# Patient Record
Sex: Female | Born: 1980 | Race: White | Hispanic: No | Marital: Single | Smoking: Current every day smoker
Health system: Southern US, Community
[De-identification: ages and names within clinical notes are randomized; demographics above are authoritative.]

## PROBLEM LIST (undated history)

## (undated) DIAGNOSIS — F909 Attention-deficit hyperactivity disorder, unspecified type: Secondary | ICD-10-CM

## (undated) DIAGNOSIS — I10 Essential (primary) hypertension: Secondary | ICD-10-CM

## (undated) DIAGNOSIS — N200 Calculus of kidney: Secondary | ICD-10-CM

## (undated) HISTORY — DX: Attention-deficit hyperactivity disorder, unspecified type: F90.9

## (undated) HISTORY — DX: Calculus of kidney: N20.0

## (undated) HISTORY — DX: Essential (primary) hypertension: I10

---

## 2001-10-09 ENCOUNTER — Other Ambulatory Visit: Admission: RE | Admit: 2001-10-09 | Discharge: 2001-10-09 | Payer: Self-pay | Admitting: *Deleted

## 2002-02-25 ENCOUNTER — Emergency Department (HOSPITAL_COMMUNITY): Admission: EM | Admit: 2002-02-25 | Discharge: 2002-02-25 | Payer: Self-pay | Admitting: Emergency Medicine

## 2002-02-25 ENCOUNTER — Encounter: Payer: Self-pay | Admitting: Emergency Medicine

## 2006-07-02 ENCOUNTER — Emergency Department (HOSPITAL_COMMUNITY): Admission: EM | Admit: 2006-07-02 | Discharge: 2006-07-02 | Payer: Self-pay | Admitting: Emergency Medicine

## 2008-06-15 IMAGING — CR DG CHEST 2V
2 series · 2 of 2 positions shown · non-contrast
Comparison: none

CLINICAL DATA: Chest pain. 
 CHEST ? 2 VIEW ? 07/02/06:

[view not recorded (1 of 2)]
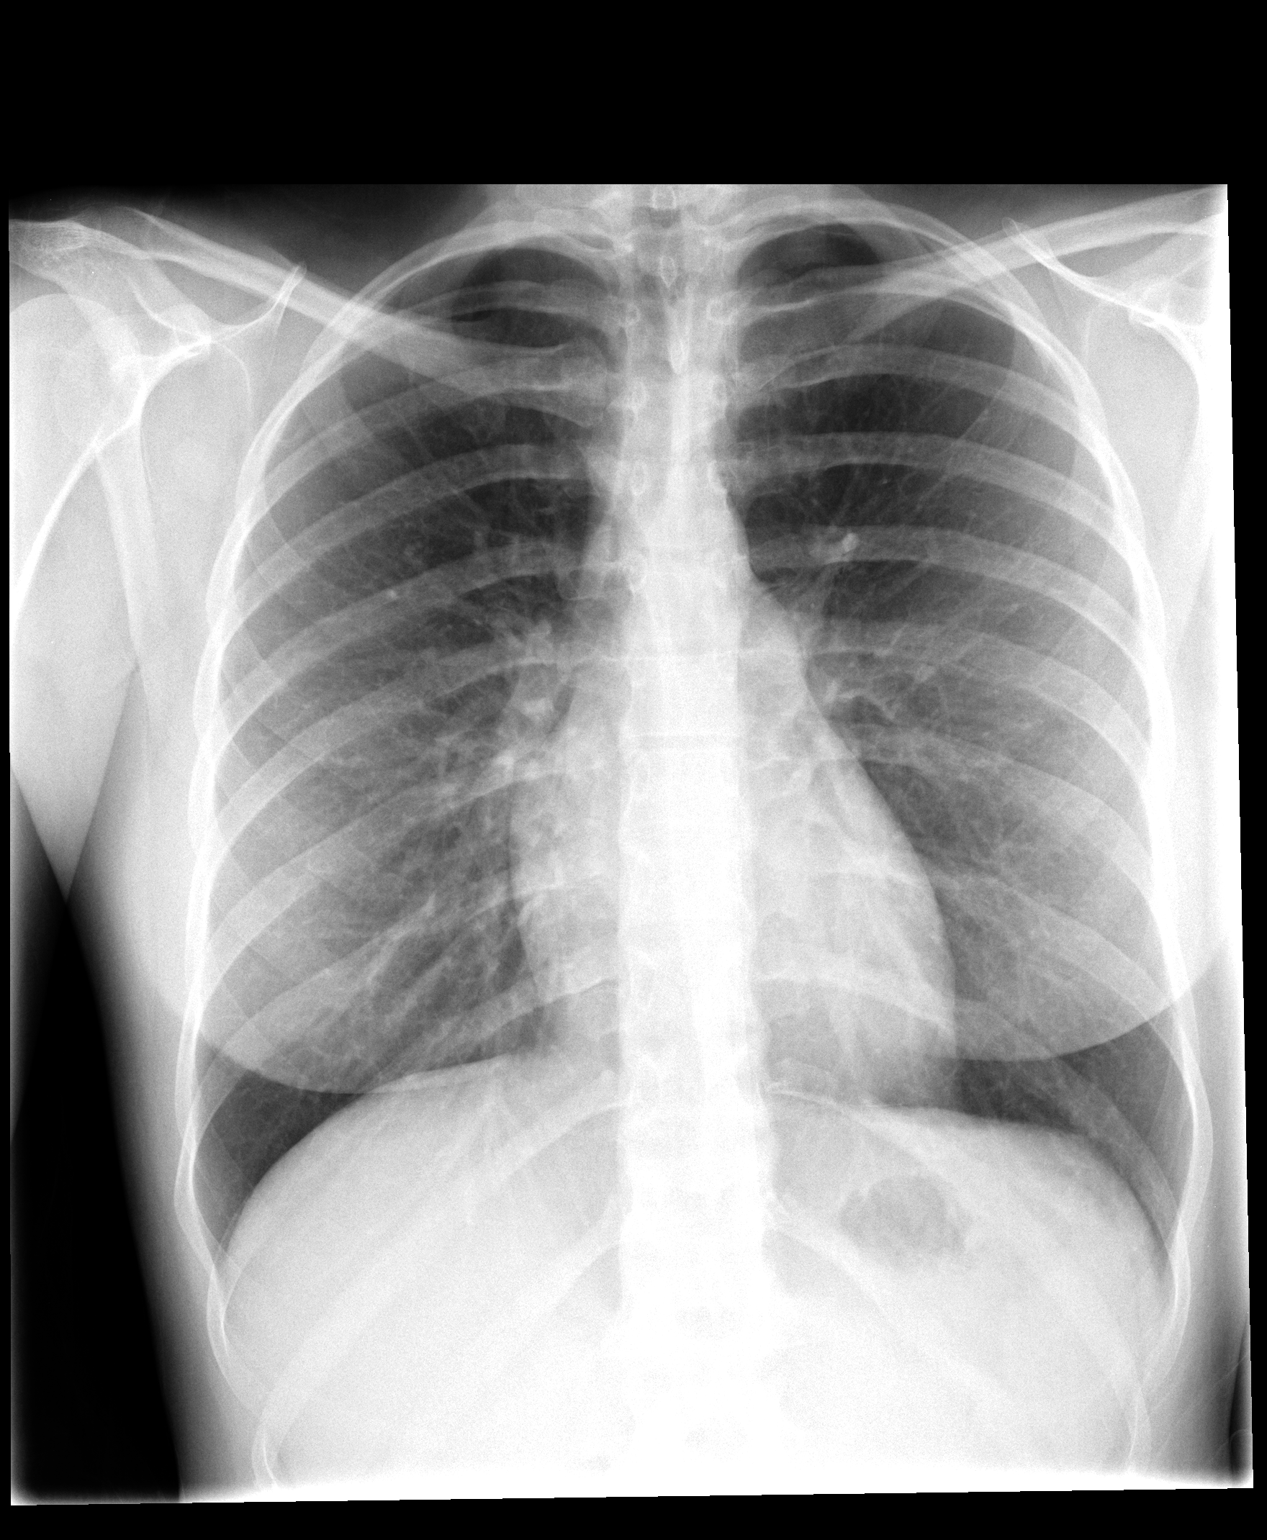

[view not recorded (2 of 2)]
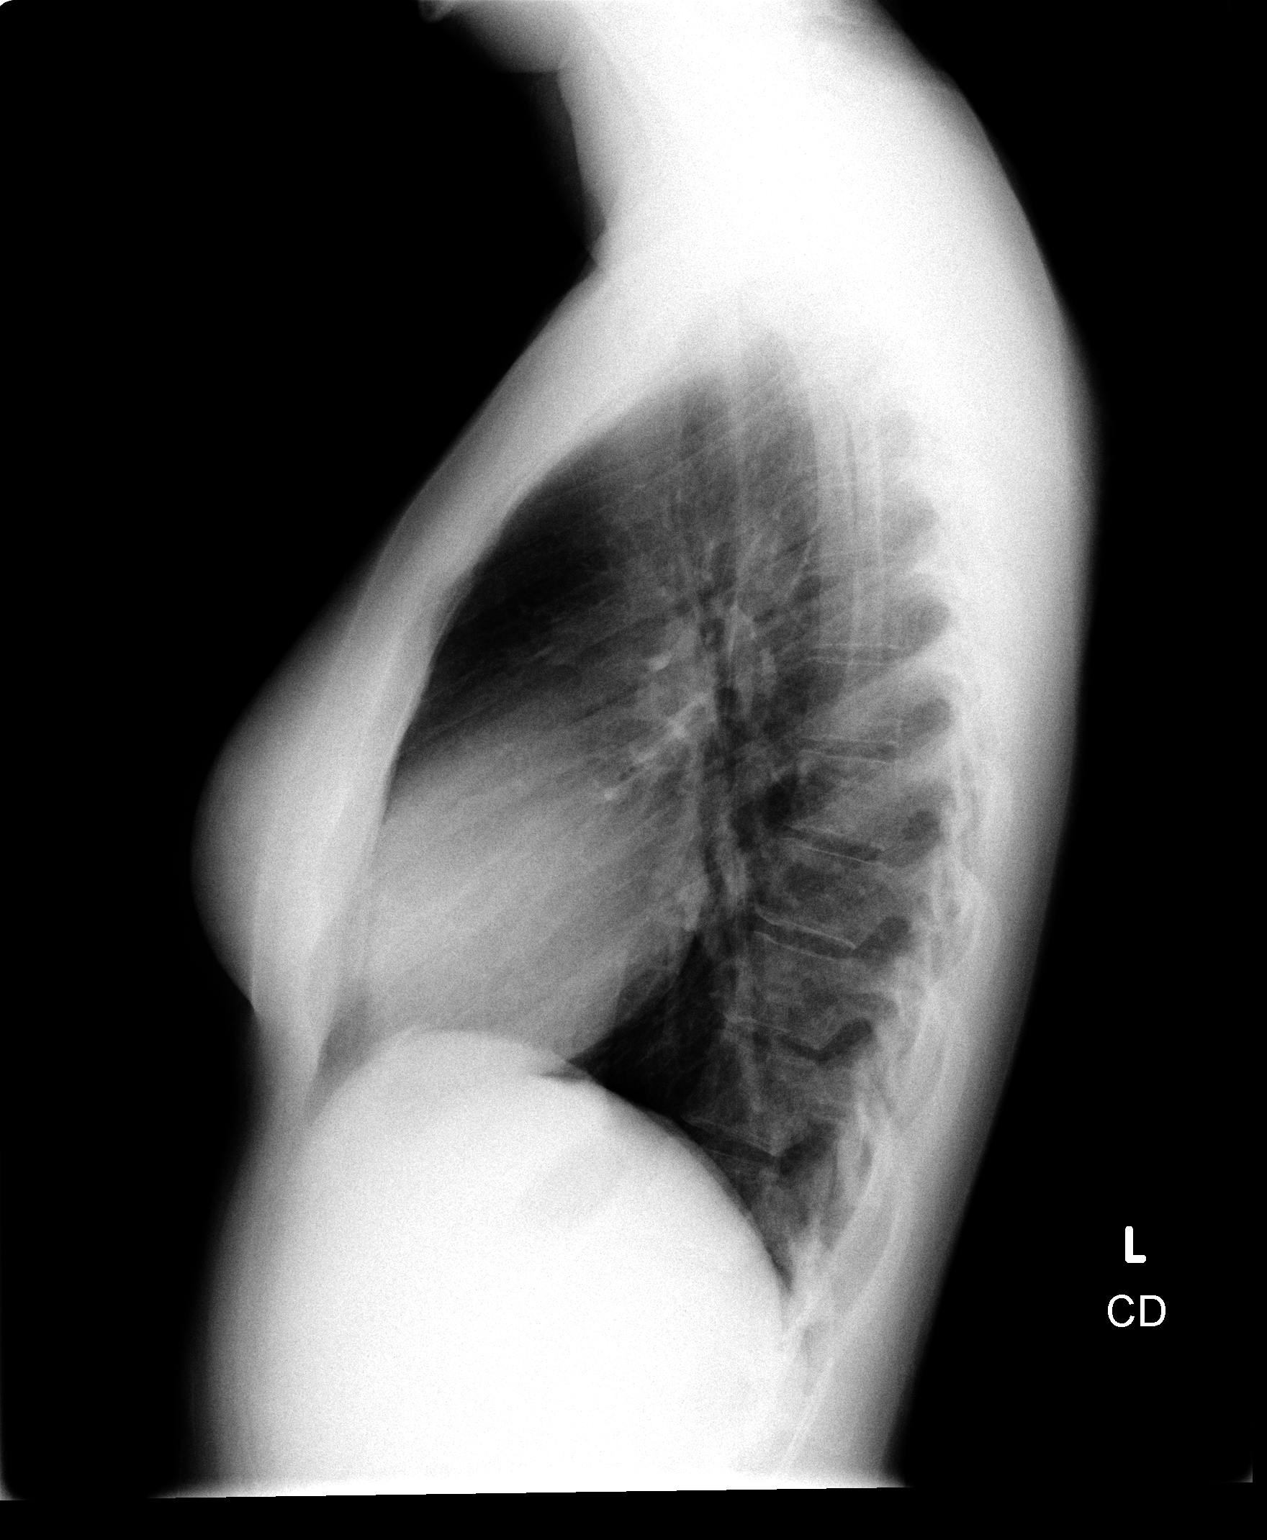

[2 of 2 positions shown; findings below may reference images not displayed]

FINDINGS: The heart size and mediastinal contours are within normal limits.  Both lungs are clear.  The visualized skeletal structures are unremarkable.
IMPRESSION: No active cardiopulmonary disease.

## 2009-06-15 ENCOUNTER — Emergency Department (HOSPITAL_COMMUNITY): Admission: EM | Admit: 2009-06-15 | Discharge: 2009-06-15 | Payer: Self-pay | Admitting: Emergency Medicine

## 2011-01-29 ENCOUNTER — Emergency Department (HOSPITAL_COMMUNITY)
Admission: EM | Admit: 2011-01-29 | Discharge: 2011-01-29 | Disposition: A | Discharge status: Home / Self Care | Payer: Self-pay | Attending: Emergency Medicine | Admitting: Emergency Medicine

## 2011-01-29 DIAGNOSIS — L270 Generalized skin eruption due to drugs and medicaments taken internally: Secondary | ICD-10-CM | POA: Insufficient documentation

## 2011-01-29 DIAGNOSIS — L02219 Cutaneous abscess of trunk, unspecified: Secondary | ICD-10-CM | POA: Insufficient documentation

## 2011-01-29 DIAGNOSIS — L03319 Cellulitis of trunk, unspecified: Secondary | ICD-10-CM | POA: Insufficient documentation

## 2011-01-29 DIAGNOSIS — T370X5A Adverse effect of sulfonamides, initial encounter: Secondary | ICD-10-CM | POA: Insufficient documentation

## 2011-01-29 DIAGNOSIS — F429 Obsessive-compulsive disorder, unspecified: Secondary | ICD-10-CM | POA: Insufficient documentation

## 2016-05-10 ENCOUNTER — Telehealth: Payer: Self-pay | Admitting: Family

## 2016-05-10 DIAGNOSIS — A499 Bacterial infection, unspecified: Secondary | ICD-10-CM

## 2016-05-10 DIAGNOSIS — N390 Urinary tract infection, site not specified: Secondary | ICD-10-CM

## 2016-05-10 MED ORDER — CIPROFLOXACIN HCL 500 MG PO TABS: 500.0000 mg | ORAL_TABLET | Freq: Two times a day (BID) | ORAL | 0 refills | Status: DC

## 2016-05-10 NOTE — Progress Notes (Signed)

## 2020-05-05 ENCOUNTER — Encounter (HOSPITAL_COMMUNITY): Payer: Self-pay | Admitting: Emergency Medicine

## 2020-05-05 ENCOUNTER — Other Ambulatory Visit: Payer: Self-pay

## 2020-05-05 ENCOUNTER — Emergency Department (HOSPITAL_COMMUNITY)
Admission: EM | Admit: 2020-05-05 | Discharge: 2020-05-06 | Disposition: A | Discharge status: Home / Self Care | Payer: HRSA Program | Attending: Emergency Medicine | Admitting: Emergency Medicine

## 2020-05-05 DIAGNOSIS — I10 Essential (primary) hypertension: Secondary | ICD-10-CM | POA: Diagnosis not present

## 2020-05-05 DIAGNOSIS — U071 COVID-19: Secondary | ICD-10-CM | POA: Insufficient documentation

## 2020-05-05 DIAGNOSIS — Z5321 Procedure and treatment not carried out due to patient leaving prior to being seen by health care provider: Secondary | ICD-10-CM | POA: Diagnosis not present

## 2020-05-05 LAB — CBC
HCT: 47.9 % — ABNORMAL HIGH (ref 36.0–46.0)
Hemoglobin: 16.1 g/dL — ABNORMAL HIGH (ref 12.0–15.0)
MCH: 30.7 pg (ref 26.0–34.0)
MCHC: 33.6 g/dL (ref 30.0–36.0)
MCV: 91.4 fL (ref 80.0–100.0)
Platelets: 230 10*3/uL (ref 150–400)
RBC: 5.24 MIL/uL — ABNORMAL HIGH (ref 3.87–5.11)
RDW: 13 % (ref 11.5–15.5)
WBC: 5.2 10*3/uL (ref 4.0–10.5)
nRBC: 0 % (ref 0.0–0.2)

## 2020-05-05 LAB — URINALYSIS, ROUTINE W REFLEX MICROSCOPIC
Bilirubin Urine: NEGATIVE
Glucose, UA: NEGATIVE mg/dL
Ketones, ur: NEGATIVE mg/dL
Leukocytes,Ua: NEGATIVE
Nitrite: NEGATIVE
Protein, ur: NEGATIVE mg/dL
Specific Gravity, Urine: 1.02 (ref 1.005–1.030)
pH: 5.5 (ref 5.0–8.0)

## 2020-05-05 LAB — RESP PANEL BY RT-PCR (FLU A&B, COVID) ARPGX2
Influenza A by PCR: NEGATIVE
Influenza B by PCR: NEGATIVE
SARS Coronavirus 2 by RT PCR: POSITIVE — AB

## 2020-05-05 LAB — I-STAT BETA HCG BLOOD, ED (MC, WL, AP ONLY): I-stat hCG, quantitative: <5 m[IU]/mL (ref <5)

## 2020-05-05 LAB — COMPREHENSIVE METABOLIC PANEL
ALT: 255 U/L — ABNORMAL HIGH (ref 0–44)
AST: 338 U/L — ABNORMAL HIGH (ref 15–41)
Albumin: 3.6 g/dL (ref 3.5–5.0)
Alkaline Phosphatase: 53 U/L (ref 38–126)
Anion gap: 12 (ref 5–15)
BUN: 19 mg/dL (ref 6–20)
CO2: 23 mmol/L (ref 22–32)
Calcium: 8.8 mg/dL — ABNORMAL LOW (ref 8.9–10.3)
Chloride: 101 mmol/L (ref 98–111)
Creatinine, Ser: 0.66 mg/dL (ref 0.44–1.00)
GFR, Estimated: >60 mL/min (ref >60)
Glucose, Bld: 105 mg/dL — ABNORMAL HIGH (ref 70–99)
Potassium: 4 mmol/L (ref 3.5–5.1)
Sodium: 136 mmol/L (ref 135–145)
Total Bilirubin: 0.5 mg/dL (ref 0.3–1.2)
Total Protein: 6.8 g/dL (ref 6.5–8.1)

## 2020-05-05 LAB — URINALYSIS, MICROSCOPIC (REFLEX)

## 2020-05-05 LAB — LIPASE, BLOOD: Lipase: 27 U/L (ref 11–51)

## 2020-05-05 NOTE — ED Notes (Signed)
Pt does not answer when called for vitals check

## 2020-05-05 NOTE — ED Triage Notes (Signed)
Pt states she tested +covid at home on Wednesday. C/o chills, nausea/vomiting since. Hx HTN, unable to keep medications down.

## 2020-05-05 NOTE — ED Notes (Signed)
Tried calling pt X4 to get them in isolation box with no response

## 2020-11-25 ENCOUNTER — Emergency Department (HOSPITAL_COMMUNITY): Admission: EM | Admit: 2020-11-25 | Discharge: 2020-11-25 | Discharge status: Against Medical Advice | Payer: Self-pay

## 2020-11-25 NOTE — ED Notes (Signed)
Patient called for triage vitals x1 with no response

## 2022-08-22 NOTE — Progress Notes (Signed)
Subjective:    Linda Deleon - 42 y.o. female MRN [id number]  Date of birth: [birth year]  HPI  Linda Deleon is to establish care.   Current issues and/or concerns: - Doing well on Metoprolol for high blood pressure, no issues/concerns. Needs refills. She denies red flag symptoms such as but not limited to chest pain, shortness of breath, worst headache of life, nausea/vomiting. She does not monitor salt intake. She does not exercise outside of normal routine.  - Reports she was established with an ADHD specialist and taking Adderall at that time. Reports she has not taken the same for 3 months due to the specialist's office closing.  - No further issues/concerns for discussion today.    ROS per HPI     Health Maintenance:  Health Maintenance Due  Topic Date Due   COVID-19 Vaccine (1) Never done   HIV Screening  Never done   Hepatitis C Screening  Never done   DTaP/Tdap/Td (1 - Tdap) Never done   PAP SMEAR-Modifier  Never done     Past Medical History: There are no problems to display for this patient.     Social History   reports that she has been smoking cigarettes. She started smoking about 15 months ago. She has been smoking an average of .5 packs per day. She has never used smokeless tobacco.   Family History  family history is not on file.   Medications: reviewed and updated   Objective:   Physical Exam BP 128/87 (BP Location: Right Arm, Patient Position: Sitting, Cuff Size: Normal)   Pulse (!) 111   Temp 97.6 F (36.4 C) (Oral)   Resp (!) 26   Ht 5\' 4"  (1.626 m)   Wt 118 lb (53.5 kg)   LMP 08/02/2022   SpO2 98%   BMI 20.25 kg/m   Physical Exam HENT:     Head: Normocephalic and atraumatic.  Eyes:     Extraocular Movements: Extraocular movements intact.     Conjunctiva/sclera: Conjunctivae normal.     Pupils: Pupils are equal, round, and reactive to light.  Cardiovascular:     Rate and Rhythm: Tachycardia present.  Pulmonary:     Effort:  Pulmonary effort is normal.     Breath sounds: Normal breath sounds.  Musculoskeletal:     Cervical back: Normal range of motion and neck supple.  Neurological:     General: No focal deficit present.     Mental Status: She is alert and oriented to person, place, and time.  Psychiatric:        Mood and Affect: Mood normal.        Behavior: Behavior normal.    Assessment & Plan:  1. Encounter to establish care - Patient presents today to establish care. During the interim follow-up with primary provider as scheduled.  - Return for annual physical examination, labs, and health maintenance. Arrive fasting meaning having no food for at least 8 hours prior to appointment. You may have only water or black coffee. Please take scheduled medications as normal.  2. Primary hypertension - Continue Metoprolol Tartrate as prescribed.  - Counseled on blood pressure goal of less than 130/80, low-sodium, DASH diet, medication compliance, and 150 minutes of moderate intensity exercise per week as tolerated. Counseled on medication adherence and adverse effects. - Follow-up with primary provider in 6 months or sooner if needed. - metoprolol tartrate (LOPRESSOR) 50 MG tablet; Take 1 tablet (50 mg total) by mouth 2 (two) times daily.  Dispense: 180  tablet; Refill: 0  3. Attention deficit hyperactivity disorder (ADHD), unspecified ADHD type - I did check the [facility] prescription drug database. Patient's report does not correlate with the same. Will need patient's medical records prior to refilling requested Adderall. This plan was discussed with Georganna Skeans, MD. - Referral to Psychiatry for further evaluation/management.  - Ambulatory referral to Psychiatry    Patient was given clear instructions to go to Emergency Department or return to medical center if symptoms don't improve, worsen, or new problems develop.The patient verbalized understanding.  I discussed the assessment and treatment plan  with the patient. The patient was provided an opportunity to ask questions and all were answered. The patient agreed with the plan and demonstrated an understanding of the instructions.   The patient was advised to call back or seek an in-person evaluation if the symptoms worsen or if the condition fails to improve as anticipated.    Ricky Stabs, NP 08/30/2022, 4:33 PM Primary Care at [facility]

## 2022-08-30 ENCOUNTER — Ambulatory Visit (INDEPENDENT_AMBULATORY_CARE_PROVIDER_SITE_OTHER): Payer: Medicaid Other | Admitting: Family

## 2022-08-30 ENCOUNTER — Telehealth: Payer: Self-pay | Admitting: Family

## 2022-08-30 ENCOUNTER — Encounter: Payer: Self-pay | Admitting: Family

## 2022-08-30 VITALS — BP 128/87 | HR 111 | Temp 97.6°F | Resp 26 | Ht 64.0 in | Wt 118.0 lb

## 2022-08-30 DIAGNOSIS — I10 Essential (primary) hypertension: Secondary | ICD-10-CM

## 2022-08-30 DIAGNOSIS — F909 Attention-deficit hyperactivity disorder, unspecified type: Secondary | ICD-10-CM | POA: Diagnosis not present

## 2022-08-30 DIAGNOSIS — F17210 Nicotine dependence, cigarettes, uncomplicated: Secondary | ICD-10-CM | POA: Diagnosis not present

## 2022-08-30 DIAGNOSIS — Z7689 Persons encountering health services in other specified circumstances: Secondary | ICD-10-CM

## 2022-08-30 MED ORDER — METOPROLOL TARTRATE 50 MG PO TABS: 50.0000 mg | ORAL_TABLET | Freq: Two times a day (BID) | ORAL | 0 refills | Status: DC

## 2022-08-30 NOTE — Progress Notes (Signed)
ADHD- Dr. Clarene Duke out of [place] Off medication x 3 mos or more  HTN Refill  Metoprolol 50 mg BID Takes everyday

## 2022-08-30 NOTE — Patient Instructions (Signed)
Thank you for choosing Primary Care at [facility] for your medical home!    Linda Deleon was seen by Rema Fendt, NP today.   Linda Deleon's primary care provider is Rema Fendt, NP.   For the best care possible,  you should try to see Linda Stabs, NP whenever you come to office.   We look forward to seeing you again soon!  If you have any questions about your visit today,  please call us at [phone]  Or feel free to reach your provider via MyChart.   Keeping you healthy   Get these tests Blood pressure- Have your blood pressure checked once a year by your healthcare provider.  Normal blood pressure is 120/80. Weight- Have your body mass index (BMI) calculated to screen for obesity.  BMI is a measure of body fat based on height and weight. You can also calculate your own BMI at https://www.west-esparza.com/. Cholesterol- Have your cholesterol checked regularly starting at age 53, sooner may be necessary if you have diabetes, high blood pressure, if a family member developed heart diseases at an early age or if you smoke.  Chlamydia, HIV, and other sexual transmitted disease- Get screened each year until the age of 68 then within three months of each new sexual partner. Diabetes- Have your blood sugar checked regularly if you have high blood pressure, high cholesterol, a family history of diabetes or if you are overweight.   Get these vaccines Flu shot- Every fall. Tetanus shot- Every 10 years. Menactra- Single dose; prevents meningitis.   Take these steps Don't smoke- If you do smoke, ask your healthcare provider about quitting. For tips on how to quit, go to www.smokefree.gov or call 1-800-QUIT-NOW. Be physically active- Exercise 5 days a week for at least 30 minutes.  If you are not already physically active start slow and gradually work up to 30 minutes of moderate physical activity.  Examples of moderate activity include walking briskly, mowing the yard, dancing,  swimming bicycling, etc. Eat a healthy diet- Eat a variety of healthy foods such as fruits, vegetables, low fat milk, low fat cheese, yogurt, lean meats, poultry, fish, beans, tofu, etc.  For more information on healthy eating, go to www.thenutritionsource.org Drink alcohol in moderation- Limit alcohol intake two drinks or less a day.  Never drink and drive. Dentist- Brush and floss teeth twice daily; visit your dentis twice a year. Depression-Your emotional health is as important as your physical health.  If you're feeling down, losing interest in things you normally enjoy please talk with your healthcare provider. Gun Safety- If you keep a gun in your home, keep it unloaded and with the safety lock on.  Bullets should be stored separately. Helmet use- Always wear a helmet when riding a motorcycle, bicycle, rollerblading or skateboarding. Safe sex- If you may be exposed to a sexually transmitted infection, use a condom Seat belts- Seat bels can save your life; always wear one. Smoke/Carbon Monoxide detectors- These detectors need to be installed on the appropriate level of your home.  Replace batteries at least once a year. Skin Cancer- When out in the sun, cover up and use sunscreen SPF 15 or higher. Violence- If anyone is threatening or hurting you, please tell your healthcare provider.

## 2022-09-02 NOTE — Telephone Encounter (Signed)
Attempt to call patient to relay message.  Left message on voicemail to return call.

## 2022-09-08 ENCOUNTER — Telehealth: Payer: Self-pay | Admitting: Family

## 2022-09-08 NOTE — Telephone Encounter (Signed)
Pt came in stated provider let her know she would need pts medical records before she can send In prescription for medications they discussed at appointment. Medical records were put in providers box and will be scanned and sent to scan center after provider reviews.

## 2022-09-08 NOTE — Telephone Encounter (Signed)
Please follow up with patient.

## 2022-09-08 NOTE — Telephone Encounter (Signed)
Pt called back, advised of message from Amy, NP. Advised pt to bring medical records she has to office and let them scan them in system so they can be reviewed. Also advised I would check with Arna Medici to see if she can FU with pt on referral. Pt states she hasn't had anyone reach out to her. Pt is upset because she had medical records with her that day and she asked provider if she needed to see them but acted like she didn't so pt feels like this could be avoided.

## 2022-09-08 NOTE — Telephone Encounter (Signed)
Pt stated she wants medication sent to [facility] in [facility]

## 2022-09-09 ENCOUNTER — Encounter (INDEPENDENT_AMBULATORY_CARE_PROVIDER_SITE_OTHER): Payer: Self-pay

## 2022-09-09 NOTE — Telephone Encounter (Signed)
Sent pt a MyChart message

## 2022-09-09 NOTE — Telephone Encounter (Signed)
Call patient with update. Since requested Adderall has been prescribed more than 6 months ago will be unable to refill. I did check the [place] prescription drug database to confirm last time prescribed 09/15/2021. Patient will need to be seen by referred specialist (Psychiatry) to restart medication. Please note I discussed plan in detail with Georganna Skeans, MD on today.

## 2022-10-26 DIAGNOSIS — Z720 Tobacco use: Secondary | ICD-10-CM | POA: Diagnosis not present

## 2022-10-26 DIAGNOSIS — F5101 Primary insomnia: Secondary | ICD-10-CM | POA: Diagnosis not present

## 2022-10-26 DIAGNOSIS — F330 Major depressive disorder, recurrent, mild: Secondary | ICD-10-CM | POA: Diagnosis not present

## 2022-11-01 DIAGNOSIS — F330 Major depressive disorder, recurrent, mild: Secondary | ICD-10-CM | POA: Diagnosis not present

## 2022-11-01 DIAGNOSIS — F900 Attention-deficit hyperactivity disorder, predominantly inattentive type: Secondary | ICD-10-CM | POA: Diagnosis not present

## 2022-11-01 DIAGNOSIS — F5101 Primary insomnia: Secondary | ICD-10-CM | POA: Diagnosis not present

## 2022-11-01 DIAGNOSIS — Z720 Tobacco use: Secondary | ICD-10-CM | POA: Diagnosis not present

## 2022-11-08 DIAGNOSIS — F330 Major depressive disorder, recurrent, mild: Secondary | ICD-10-CM | POA: Diagnosis not present

## 2022-11-08 DIAGNOSIS — F5101 Primary insomnia: Secondary | ICD-10-CM | POA: Diagnosis not present

## 2022-11-08 DIAGNOSIS — F902 Attention-deficit hyperactivity disorder, combined type: Secondary | ICD-10-CM | POA: Diagnosis not present

## 2022-12-06 ENCOUNTER — Encounter (HOSPITAL_COMMUNITY): Payer: Self-pay | Admitting: *Deleted

## 2022-12-06 ENCOUNTER — Telehealth (HOSPITAL_COMMUNITY): Payer: Self-pay

## 2022-12-06 ENCOUNTER — Ambulatory Visit (HOSPITAL_COMMUNITY)
Admission: EM | Admit: 2022-12-06 | Discharge: 2022-12-06 | Disposition: A | Discharge status: Home / Self Care | Payer: Medicaid Other | Attending: Emergency Medicine | Admitting: Emergency Medicine

## 2022-12-06 DIAGNOSIS — K120 Recurrent oral aphthae: Secondary | ICD-10-CM | POA: Diagnosis not present

## 2022-12-06 MED ORDER — AMOXICILLIN 500 MG PO CAPS: 500.0000 mg | ORAL_CAPSULE | Freq: Two times a day (BID) | ORAL | 0 refills | Status: AC

## 2022-12-06 MED ORDER — NYSTATIN 100000 UNIT/ML MT SUSP: 5.0000 mL | Freq: Four times a day (QID) | OROMUCOSAL | 0 refills | Status: DC | PRN

## 2022-12-06 MED ORDER — FLUCONAZOLE 150 MG PO TABS: 150.0000 mg | ORAL_TABLET | Freq: Every day | ORAL | 0 refills | Status: AC

## 2022-12-06 MED ORDER — LIDOCAINE VISCOUS HCL 2 % MT SOLN: 5.0000 mL | Freq: Four times a day (QID) | OROMUCOSAL | 0 refills | Status: DC | PRN

## 2022-12-06 NOTE — ED Triage Notes (Signed)
Pt states she has mouth sores and a white tongue X 1 week. She states that she is having eating due to pain.

## 2022-12-06 NOTE — ED Provider Notes (Signed)
[facility]    CSN: [id number] Arrival date & time: 12/06/22  1748      History   Chief Complaint Chief Complaint  Patient presents with   Mouth Lesions    HPI Linda Deleon is a 42 y.o. female.   Patient presents for evaluation for a sore to the side of the tongue present for 7 days.  Causing significant pain making it difficult to swallow and to tolerate food.  Has noticed a thick Divit Stipp coating across the tongue and cracking to the corners of the mouth.  Tongue feels swollen only on the left side and there is redness.  Has attempted salt water gargles and over-the-counter numbing medication which has been ineffective.  Endorses a broken tooth within the mouth that is in need of dental work but does not currently have a Education officer, community.  Denies fever or drainage.  Past Medical History:  Diagnosis Date   ADHD    Hypertension    Kidney stone     There are no problems to display for this patient.   History reviewed. No pertinent surgical history.  OB History   No obstetric history on file.      Home Medications    Prior to Admission medications   Medication Sig Start Date End Date Taking? Authorizing Provider  metoprolol tartrate (LOPRESSOR) 50 MG tablet Take 1 tablet (50 mg total) by mouth 2 (two) times daily. 08/30/22 12/06/22 Yes Zonia Kief, Amy J, NP  amoxicillin (AMOXIL) 875 MG tablet Take 875 mg by mouth daily.    [provider]    Family History History reviewed. No pertinent family history.  Social History Social History   Tobacco Use   Smoking status: Every Day    Current packs/day: 0.50    Average packs/day: 0.5 packs/day for 1.6 years (0.8 ttl pk-yrs)    Types: Cigarettes    Start date: 05/09/2021   Smokeless tobacco: Never  Vaping Use   Vaping status: Never Used  Substance Use Topics   Alcohol use: Not Currently   Drug use: Not Currently     Allergies   Sulfamethoxazole-trimethoprim   Review of Systems Review of Systems   Constitutional: Negative.   HENT:  Positive for mouth sores. Negative for congestion, dental problem, drooling, ear discharge, ear pain, facial swelling, hearing loss, nosebleeds, postnasal drip, rhinorrhea, sinus pressure, sinus pain, sneezing, sore throat, tinnitus, trouble swallowing and voice change.   Respiratory: Negative.    Cardiovascular: Negative.      Physical Exam Triage Vital Signs ED Triage Vitals  Encounter Vitals Group     BP 12/06/22 1811 131/88     Systolic BP Percentile --      Diastolic BP Percentile --      Pulse Rate 12/06/22 1811 78     Resp 12/06/22 1811 18     Temp 12/06/22 1811 98.3 F (36.8 C)     Temp Source 12/06/22 1811 Oral     SpO2 12/06/22 1811 99 %     Weight --      Height --      Head Circumference --      Peak Flow --      Pain Score 12/06/22 1808 8     Pain Loc --      Pain Education --      Exclude from Growth Chart --    No data found.  Updated Vital Signs BP 131/88 (BP Location: Left Arm)   Pulse 78   Temp 98.3  F (36.8 C) (Oral)   Resp 18   LMP 12/06/2022 (Exact Date)   SpO2 99%   Visual Acuity Right Eye Distance:   Left Eye Distance:   Bilateral Distance:    Right Eye Near:   Left Eye Near:    Bilateral Near:     Physical Exam Constitutional:      Appearance: Normal appearance.  Eyes:     Extraocular Movements: Extraocular movements intact.  Pulmonary:     Effort: Pulmonary effort is normal.  Neurological:     General: No focal deficit present.     Mental Status: She is alert and oriented to person, place, and time. Mental status is at baseline.      UC Treatments / Results  Labs (all labs ordered are listed, but only abnormal results are displayed) Labs Reviewed - No data to display  EKG   Radiology No results found.  Procedures Procedures (including critical care time)  Medications Ordered in UC Medications - No data to display  Initial Impression / Assessment and Plan / UC Course  I have  reviewed the triage vital signs and the nursing notes.  Pertinent labs & imaging results that were available during my care of the patient were reviewed by me and considered in my medical decision making (see chart for details).  Apthous ulcer of mouth  Lesion on the tongue appears to be a benign ulcer, discussed with patient, Shani Fitch coating to the tongue is present as well as dental decay throughout the lower gumline we will provide bacterial coverage with amoxicillin and Magic mouthwash for comfort, recommend over-the-counter analgesics be used consistently for pain, Diflucan sent prophylactically as she endorses yeast infections with use of antibiotics, recommend increase fluid intake until able to tolerate foods, may follow-up with his urgent care as needed Final Clinical Impressions(s) / UC Diagnoses   Final diagnoses:  None   Discharge Instructions   None    ED Prescriptions   None    PDMP not reviewed this encounter.   Valinda Hoar, [place] 12/06/22 [phone]

## 2022-12-06 NOTE — Discharge Instructions (Signed)
Declined avs 

## 2022-12-16 ENCOUNTER — Other Ambulatory Visit: Payer: Self-pay | Admitting: Family

## 2022-12-16 DIAGNOSIS — I10 Essential (primary) hypertension: Secondary | ICD-10-CM

## 2022-12-16 NOTE — Telephone Encounter (Signed)
Complete

## 2023-02-24 ENCOUNTER — Other Ambulatory Visit: Payer: Self-pay | Admitting: Family

## 2023-02-24 DIAGNOSIS — I10 Essential (primary) hypertension: Secondary | ICD-10-CM

## 2023-02-25 DIAGNOSIS — I10 Essential (primary) hypertension: Secondary | ICD-10-CM | POA: Diagnosis not present

## 2023-04-16 ENCOUNTER — Other Ambulatory Visit: Payer: Self-pay | Admitting: Family

## 2023-04-16 DIAGNOSIS — I10 Essential (primary) hypertension: Secondary | ICD-10-CM

## 2023-05-17 DIAGNOSIS — I10 Essential (primary) hypertension: Secondary | ICD-10-CM | POA: Diagnosis not present

## 2023-05-17 DIAGNOSIS — Z760 Encounter for issue of repeat prescription: Secondary | ICD-10-CM | POA: Diagnosis not present

## 2023-06-16 DIAGNOSIS — J019 Acute sinusitis, unspecified: Secondary | ICD-10-CM | POA: Diagnosis not present

## 2023-06-30 DIAGNOSIS — I10 Essential (primary) hypertension: Secondary | ICD-10-CM | POA: Diagnosis not present

## 2023-07-10 DIAGNOSIS — I10 Essential (primary) hypertension: Secondary | ICD-10-CM | POA: Diagnosis not present

## 2023-07-24 DIAGNOSIS — R0602 Shortness of breath: Secondary | ICD-10-CM | POA: Diagnosis not present

## 2023-07-24 DIAGNOSIS — Z5321 Procedure and treatment not carried out due to patient leaving prior to being seen by health care provider: Secondary | ICD-10-CM | POA: Diagnosis not present

## 2023-07-25 ENCOUNTER — Emergency Department (HOSPITAL_BASED_OUTPATIENT_CLINIC_OR_DEPARTMENT_OTHER)

## 2023-07-25 ENCOUNTER — Inpatient Hospital Stay (HOSPITAL_COMMUNITY)

## 2023-07-25 ENCOUNTER — Encounter (HOSPITAL_BASED_OUTPATIENT_CLINIC_OR_DEPARTMENT_OTHER): Payer: Self-pay

## 2023-07-25 ENCOUNTER — Inpatient Hospital Stay (HOSPITAL_BASED_OUTPATIENT_CLINIC_OR_DEPARTMENT_OTHER)
Admission: EM | Admit: 2023-07-25 | Discharge: 2023-07-28 | DRG: 871 | Disposition: A | Discharge status: Home / Self Care | Attending: Pulmonary Disease | Admitting: Pulmonary Disease

## 2023-07-25 ENCOUNTER — Other Ambulatory Visit: Payer: Self-pay

## 2023-07-25 DIAGNOSIS — F17210 Nicotine dependence, cigarettes, uncomplicated: Secondary | ICD-10-CM | POA: Diagnosis present

## 2023-07-25 DIAGNOSIS — J9602 Acute respiratory failure with hypercapnia: Secondary | ICD-10-CM | POA: Diagnosis present

## 2023-07-25 DIAGNOSIS — G9340 Encephalopathy, unspecified: Secondary | ICD-10-CM | POA: Diagnosis not present

## 2023-07-25 DIAGNOSIS — E871 Hypo-osmolality and hyponatremia: Secondary | ICD-10-CM | POA: Diagnosis present

## 2023-07-25 DIAGNOSIS — J189 Pneumonia, unspecified organism: Secondary | ICD-10-CM

## 2023-07-25 DIAGNOSIS — T39095A Adverse effect of salicylates, initial encounter: Secondary | ICD-10-CM | POA: Diagnosis not present

## 2023-07-25 DIAGNOSIS — R918 Other nonspecific abnormal finding of lung field: Secondary | ICD-10-CM | POA: Diagnosis not present

## 2023-07-25 DIAGNOSIS — Z8673 Personal history of transient ischemic attack (TIA), and cerebral infarction without residual deficits: Secondary | ICD-10-CM

## 2023-07-25 DIAGNOSIS — R509 Fever, unspecified: Secondary | ICD-10-CM | POA: Diagnosis not present

## 2023-07-25 DIAGNOSIS — M549 Dorsalgia, unspecified: Secondary | ICD-10-CM | POA: Diagnosis not present

## 2023-07-25 DIAGNOSIS — E869 Volume depletion, unspecified: Secondary | ICD-10-CM | POA: Diagnosis present

## 2023-07-25 DIAGNOSIS — Z452 Encounter for adjustment and management of vascular access device: Secondary | ICD-10-CM | POA: Diagnosis not present

## 2023-07-25 DIAGNOSIS — R29818 Other symptoms and signs involving the nervous system: Secondary | ICD-10-CM | POA: Diagnosis not present

## 2023-07-25 DIAGNOSIS — A419 Sepsis, unspecified organism: Principal | ICD-10-CM | POA: Diagnosis present

## 2023-07-25 DIAGNOSIS — I10 Essential (primary) hypertension: Secondary | ICD-10-CM | POA: Diagnosis not present

## 2023-07-25 DIAGNOSIS — G9341 Metabolic encephalopathy: Secondary | ICD-10-CM | POA: Diagnosis present

## 2023-07-25 DIAGNOSIS — Z1152 Encounter for screening for COVID-19: Secondary | ICD-10-CM

## 2023-07-25 DIAGNOSIS — B370 Candidal stomatitis: Secondary | ICD-10-CM | POA: Diagnosis present

## 2023-07-25 DIAGNOSIS — Z881 Allergy status to other antibiotic agents status: Secondary | ICD-10-CM | POA: Diagnosis not present

## 2023-07-25 DIAGNOSIS — T39091A Poisoning by salicylates, accidental (unintentional), initial encounter: Secondary | ICD-10-CM | POA: Diagnosis not present

## 2023-07-25 DIAGNOSIS — J690 Pneumonitis due to inhalation of food and vomit: Secondary | ICD-10-CM | POA: Diagnosis not present

## 2023-07-25 DIAGNOSIS — T39094A Poisoning by salicylates, undetermined, initial encounter: Secondary | ICD-10-CM | POA: Diagnosis not present

## 2023-07-25 DIAGNOSIS — Z992 Dependence on renal dialysis: Secondary | ICD-10-CM

## 2023-07-25 DIAGNOSIS — F909 Attention-deficit hyperactivity disorder, unspecified type: Secondary | ICD-10-CM | POA: Diagnosis not present

## 2023-07-25 DIAGNOSIS — Z79899 Other long term (current) drug therapy: Secondary | ICD-10-CM

## 2023-07-25 DIAGNOSIS — R4182 Altered mental status, unspecified: Secondary | ICD-10-CM | POA: Diagnosis not present

## 2023-07-25 DIAGNOSIS — E876 Hypokalemia: Secondary | ICD-10-CM | POA: Diagnosis not present

## 2023-07-25 DIAGNOSIS — R569 Unspecified convulsions: Secondary | ICD-10-CM | POA: Diagnosis not present

## 2023-07-25 DIAGNOSIS — R000 Tachycardia, unspecified: Secondary | ICD-10-CM | POA: Diagnosis not present

## 2023-07-25 DIAGNOSIS — R6520 Severe sepsis without septic shock: Secondary | ICD-10-CM | POA: Diagnosis present

## 2023-07-25 DIAGNOSIS — E874 Mixed disorder of acid-base balance: Secondary | ICD-10-CM | POA: Diagnosis present

## 2023-07-25 DIAGNOSIS — J984 Other disorders of lung: Secondary | ICD-10-CM | POA: Diagnosis not present

## 2023-07-25 DIAGNOSIS — E872 Acidosis, unspecified: Secondary | ICD-10-CM | POA: Diagnosis not present

## 2023-07-25 DIAGNOSIS — Z882 Allergy status to sulfonamides status: Secondary | ICD-10-CM | POA: Diagnosis not present

## 2023-07-25 DIAGNOSIS — Z0389 Encounter for observation for other suspected diseases and conditions ruled out: Secondary | ICD-10-CM | POA: Diagnosis not present

## 2023-07-25 DIAGNOSIS — J9601 Acute respiratory failure with hypoxia: Secondary | ICD-10-CM | POA: Diagnosis not present

## 2023-07-25 DIAGNOSIS — R0602 Shortness of breath: Secondary | ICD-10-CM | POA: Diagnosis not present

## 2023-07-25 DIAGNOSIS — R059 Cough, unspecified: Secondary | ICD-10-CM | POA: Diagnosis not present

## 2023-07-25 LAB — I-STAT VENOUS BLOOD GAS, ED
Acid-base deficit: 9 mmol/L — ABNORMAL HIGH (ref 0.0–2.0)
Acid-base deficit: 14 mmol/L — ABNORMAL HIGH (ref 0.0–2.0)
Bicarbonate: 14.1 mmol/L — ABNORMAL LOW (ref 20.0–28.0)
Bicarbonate: 9.8 mmol/L — ABNORMAL LOW (ref 20.0–28.0)
Calcium, Ion: 0.82 mmol/L — CL (ref 1.15–1.40)
Calcium, Ion: 0.76 mmol/L — CL (ref 1.15–1.40)
HCT: 46 % (ref 36.0–46.0)
HCT: 35 % — ABNORMAL LOW (ref 36.0–46.0)
Hemoglobin: 15.6 g/dL — ABNORMAL HIGH (ref 12.0–15.0)
Hemoglobin: 11.9 g/dL — ABNORMAL LOW (ref 12.0–15.0)
O2 Saturation: 27 %
O2 Saturation: 91 %
Patient temperature: 97.3
Patient temperature: 97.3
Potassium: 4.1 mmol/L (ref 3.5–5.1)
Potassium: 3.9 mmol/L (ref 3.5–5.1)
Sodium: 141 mmol/L (ref 135–145)
Sodium: 143 mmol/L (ref 135–145)
TCO2: 15 mmol/L — ABNORMAL LOW (ref 22–32)
TCO2: 10 mmol/L — ABNORMAL LOW (ref 22–32)
pCO2, Ven: 23.6 mmHg — ABNORMAL LOW (ref 44–60)
pCO2, Ven: 17.7 mmHg — CL (ref 44–60)
pH, Ven: 7.381 (ref 7.25–7.43)
pH, Ven: 7.346 (ref 7.25–7.43)
pO2, Ven: 17 mmHg — CL (ref 32–45)
pO2, Ven: 59 mmHg — ABNORMAL HIGH (ref 32–45)

## 2023-07-25 LAB — CBC WITH DIFFERENTIAL/PLATELET
Abs Immature Granulocytes: 0.05 10*3/uL (ref 0.00–0.07)
Basophils Absolute: 0.1 10*3/uL (ref 0.0–0.1)
Basophils Relative: 1 %
Eosinophils Absolute: 0 10*3/uL (ref 0.0–0.5)
Eosinophils Relative: 0 %
HCT: 44.2 % (ref 36.0–46.0)
Hemoglobin: 14.8 g/dL (ref 12.0–15.0)
Immature Granulocytes: 0 %
Lymphocytes Relative: 6 %
Lymphs Abs: 0.7 10*3/uL (ref 0.7–4.0)
MCH: 30.6 pg (ref 26.0–34.0)
MCHC: 33.5 g/dL (ref 30.0–36.0)
MCV: 91.5 fL (ref 80.0–100.0)
Monocytes Absolute: 0.8 10*3/uL (ref 0.1–1.0)
Monocytes Relative: 7 %
Neutro Abs: 10.2 10*3/uL — ABNORMAL HIGH (ref 1.7–7.7)
Neutrophils Relative %: 86 %
Platelets: 233 10*3/uL (ref 150–400)
RBC: 4.83 MIL/uL (ref 3.87–5.11)
RDW: 15 % (ref 11.5–15.5)
WBC: 11.7 10*3/uL — ABNORMAL HIGH (ref 4.0–10.5)
nRBC: 0.3 % — ABNORMAL HIGH (ref 0.0–0.2)

## 2023-07-25 LAB — COMPREHENSIVE METABOLIC PANEL
ALT: 22 U/L (ref 0–44)
AST: 32 U/L (ref 15–41)
Albumin: 4 g/dL (ref 3.5–5.0)
Alkaline Phosphatase: 60 U/L (ref 38–126)
Anion gap: 17 — ABNORMAL HIGH (ref 5–15)
BUN: 20 mg/dL (ref 6–20)
CO2: 14 mmol/L — ABNORMAL LOW (ref 22–32)
Calcium: 8.4 mg/dL — ABNORMAL LOW (ref 8.9–10.3)
Chloride: 109 mmol/L (ref 98–111)
Creatinine, Ser: 0.92 mg/dL (ref 0.44–1.00)
GFR, Estimated: >60 mL/min (ref >60)
Glucose, Bld: 109 mg/dL — ABNORMAL HIGH (ref 70–99)
Potassium: 4 mmol/L (ref 3.5–5.1)
Sodium: 140 mmol/L (ref 135–145)
Total Bilirubin: 0.7 mg/dL (ref 0.0–1.2)
Total Protein: 7.8 g/dL (ref 6.5–8.1)

## 2023-07-25 LAB — URINALYSIS, ROUTINE W REFLEX MICROSCOPIC
Bilirubin Urine: NEGATIVE
Glucose, UA: NEGATIVE mg/dL
Ketones, ur: 80 mg/dL — AB
Nitrite: NEGATIVE
Protein, ur: 100 mg/dL — AB
Specific Gravity, Urine: >=1.03 (ref 1.005–1.030)
pH: 5 (ref 5.0–8.0)

## 2023-07-25 LAB — RESP PANEL BY RT-PCR (RSV, FLU A&B, COVID)  RVPGX2
Influenza A by PCR: NEGATIVE
Influenza B by PCR: NEGATIVE
Resp Syncytial Virus by PCR: NEGATIVE
SARS Coronavirus 2 by RT PCR: NEGATIVE

## 2023-07-25 LAB — CBC
HCT: 31.8 % — ABNORMAL LOW (ref 36.0–46.0)
Hemoglobin: 10.9 g/dL — ABNORMAL LOW (ref 12.0–15.0)
MCH: 31.4 pg (ref 26.0–34.0)
MCHC: 34.3 g/dL (ref 30.0–36.0)
MCV: 91.6 fL (ref 80.0–100.0)
Platelets: 174 10*3/uL (ref 150–400)
RBC: 3.47 MIL/uL — ABNORMAL LOW (ref 3.87–5.11)
RDW: 15.1 % (ref 11.5–15.5)
WBC: 11.9 10*3/uL — ABNORMAL HIGH (ref 4.0–10.5)
nRBC: 0 % (ref 0.0–0.2)

## 2023-07-25 LAB — RAPID URINE DRUG SCREEN, HOSP PERFORMED
Amphetamines: NOT DETECTED
Barbiturates: NOT DETECTED
Benzodiazepines: POSITIVE — AB
Cocaine: NOT DETECTED
Opiates: NOT DETECTED
Tetrahydrocannabinol: NOT DETECTED

## 2023-07-25 LAB — CK: Total CK: 158 U/L (ref 38–234)

## 2023-07-25 LAB — URINALYSIS, MICROSCOPIC (REFLEX)

## 2023-07-25 LAB — AMMONIA: Ammonia: 17 umol/L (ref 9–35)

## 2023-07-25 LAB — HCG, QUANTITATIVE, PREGNANCY: hCG, Beta Chain, Quant, S: <1 m[IU]/mL (ref <5)

## 2023-07-25 LAB — MRSA NEXT GEN BY PCR, NASAL: MRSA by PCR Next Gen: NOT DETECTED

## 2023-07-25 LAB — CBG MONITORING, ED: Glucose-Capillary: 113 mg/dL — ABNORMAL HIGH (ref 70–99)

## 2023-07-25 LAB — HIV ANTIBODY (ROUTINE TESTING W REFLEX): HIV Screen 4th Generation wRfx: NONREACTIVE

## 2023-07-25 LAB — ETHANOL: Alcohol, Ethyl (B): <10 mg/dL (ref <10)

## 2023-07-25 LAB — GLUCOSE, CAPILLARY
Glucose-Capillary: 122 mg/dL — ABNORMAL HIGH (ref 70–99)
Glucose-Capillary: 254 mg/dL — ABNORMAL HIGH (ref 70–99)
Glucose-Capillary: 277 mg/dL — ABNORMAL HIGH (ref 70–99)
Glucose-Capillary: 285 mg/dL — ABNORMAL HIGH (ref 70–99)

## 2023-07-25 LAB — SALICYLATE LEVEL: Salicylate Lvl: 92.5 mg/dL (ref 7.0–30.0)

## 2023-07-25 LAB — LIPASE, BLOOD: Lipase: 21 U/L (ref 11–51)

## 2023-07-25 LAB — LACTIC ACID, PLASMA: Lactic Acid, Venous: 0.8 mmol/L (ref 0.5–1.9)

## 2023-07-25 LAB — T4, FREE: Free T4: 0.57 ng/dL — ABNORMAL LOW (ref 0.61–1.12)

## 2023-07-25 LAB — HEPATITIS B SURFACE ANTIGEN: Hepatitis B Surface Ag: NONREACTIVE

## 2023-07-25 LAB — TSH: TSH: 0.194 u[IU]/mL — ABNORMAL LOW (ref 0.350–4.500)

## 2023-07-25 LAB — ACETAMINOPHEN LEVEL: Acetaminophen (Tylenol), Serum: <10 ug/mL — ABNORMAL LOW (ref 10–30)

## 2023-07-25 LAB — TROPONIN I (HIGH SENSITIVITY): Troponin I (High Sensitivity): 5 ng/L (ref <18)

## 2023-07-25 LAB — BRAIN NATRIURETIC PEPTIDE: B Natriuretic Peptide: 120.2 pg/mL — ABNORMAL HIGH (ref 0.0–100.0)

## 2023-07-25 LAB — OSMOLALITY: Osmolality: 297 mosm/kg — ABNORMAL HIGH (ref 275–295)

## 2023-07-25 LAB — BETA-HYDROXYBUTYRIC ACID: Beta-Hydroxybutyric Acid: 0.89 mmol/L — ABNORMAL HIGH (ref 0.05–0.27)

## 2023-07-25 MED ORDER — ANTICOAGULANT SODIUM CITRATE 4% (200MG/5ML) IV SOLN: 5.0000 mL | Status: DC | PRN

## 2023-07-25 MED ORDER — LORAZEPAM 2 MG/ML IJ SOLN
2.0000 mg | Freq: Once | INTRAMUSCULAR | Status: DC
Start: 2023-07-25 — End: 2023-07-25
  Filled 2023-07-25: qty 1

## 2023-07-25 MED ORDER — LORAZEPAM 2 MG/ML IJ SOLN
2.0000 mg | Freq: Once | INTRAMUSCULAR | Status: AC
  Administered 2023-07-25: 2 mg via INTRAVENOUS
  Filled 2023-07-25: qty 1

## 2023-07-25 MED ORDER — VANCOMYCIN HCL IN DEXTROSE 1-5 GM/200ML-% IV SOLN
1000.0000 mg | Freq: Once | INTRAVENOUS | Status: AC
  Administered 2023-07-25: 1000 mg via INTRAVENOUS
  Filled 2023-07-25: qty 200

## 2023-07-25 MED ORDER — SODIUM CHLORIDE 0.9 % IV SOLN: 2.0000 g | Freq: Once | INTRAVENOUS | Status: DC

## 2023-07-25 MED ORDER — HALOPERIDOL LACTATE 5 MG/ML IJ SOLN
5.0000 mg | Freq: Once | INTRAMUSCULAR | Status: AC
  Administered 2023-07-25: 5 mg via INTRAVENOUS
  Filled 2023-07-25: qty 1

## 2023-07-25 MED ORDER — HEPARIN SOD (PORK) LOCK FLUSH 100 UNIT/ML IV SOLN
500.0000 [IU] | Freq: Once | INTRAVENOUS | Status: DC
  Filled 2023-07-25: qty 5

## 2023-07-25 MED ORDER — CHLORHEXIDINE GLUCONATE CLOTH 2 % EX PADS: 6.0000 | MEDICATED_PAD | Freq: Every day | CUTANEOUS | Status: DC

## 2023-07-25 MED ORDER — HEPARIN SODIUM (PORCINE) 1000 UNIT/ML DIALYSIS
1000.0000 [IU] | INTRAMUSCULAR | Status: DC | PRN
  Administered 2023-07-26: 1000 [IU]
  Filled 2023-07-25: qty 1

## 2023-07-25 MED ORDER — SODIUM CHLORIDE 0.9 % IV BOLUS
1000.0000 mL | Freq: Once | INTRAVENOUS | Status: AC
Start: 2023-07-25 — End: 2023-07-25
  Administered 2023-07-25: 1000 mL via INTRAVENOUS

## 2023-07-25 MED ORDER — STERILE WATER FOR INJECTION IJ SOLN
INTRAMUSCULAR | Status: AC
Start: 2023-07-25 — End: 2023-07-26
  Filled 2023-07-25: qty 20

## 2023-07-25 MED ORDER — HEPARIN SODIUM (PORCINE) 1000 UNIT/ML DIALYSIS: 1000.0000 [IU] | INTRAMUSCULAR | Status: DC | PRN

## 2023-07-25 MED ORDER — LIDOCAINE-PRILOCAINE 2.5-2.5 % EX CREA: 1.0000 | TOPICAL_CREAM | CUTANEOUS | Status: DC | PRN

## 2023-07-25 MED ORDER — DIAZEPAM 5 MG/ML IJ SOLN
2.5000 mg | Freq: Once | INTRAMUSCULAR | Status: AC
  Administered 2023-07-25: 2.5 mg via INTRAVENOUS
  Filled 2023-07-25: qty 2

## 2023-07-25 MED ORDER — SODIUM CHLORIDE 0.9 % IV SOLN
500.0000 mg | Freq: Once | INTRAVENOUS | Status: AC
  Administered 2023-07-25: 500 mg via INTRAVENOUS
  Filled 2023-07-25: qty 5

## 2023-07-25 MED ORDER — ZIPRASIDONE MESYLATE 20 MG IM SOLR
20.0000 mg | Freq: Once | INTRAMUSCULAR | Status: DC
  Filled 2023-07-25: qty 20

## 2023-07-25 MED ORDER — HEPARIN SODIUM (PORCINE) 1000 UNIT/ML DIALYSIS
1000.0000 [IU] | INTRAMUSCULAR | Status: DC | PRN
  Administered 2023-07-25: 1000 [IU]

## 2023-07-25 MED ORDER — LIDOCAINE HCL (PF) 1 % IJ SOLN: 5.0000 mL | INTRAMUSCULAR | Status: DC | PRN

## 2023-07-25 MED ORDER — ALTEPLASE 2 MG IJ SOLR: 2.0000 mg | Freq: Once | INTRAMUSCULAR | Status: DC | PRN

## 2023-07-25 MED ORDER — DEXTROSE 10 % IV SOLN: INTRAVENOUS | Status: DC

## 2023-07-25 MED ORDER — PENTAFLUOROPROP-TETRAFLUOROETH EX AERO: 1.0000 | INHALATION_SPRAY | CUTANEOUS | Status: DC | PRN

## 2023-07-25 MED ORDER — LORAZEPAM 2 MG/ML IJ SOLN
1.0000 mg | Freq: Once | INTRAMUSCULAR | Status: DC
  Filled 2023-07-25: qty 1

## 2023-07-25 MED ORDER — ENOXAPARIN SODIUM 40 MG/0.4ML IJ SOSY
40.0000 mg | PREFILLED_SYRINGE | INTRAMUSCULAR | Status: DC
  Administered 2023-07-25: 40 mg via SUBCUTANEOUS
  Filled 2023-07-25: qty 0.4

## 2023-07-25 MED ORDER — SODIUM BICARBONATE 8.4 % IV SOLN
INTRAVENOUS | Status: DC
  Filled 2023-07-25: qty 1000
  Filled 2023-07-25: qty 150
  Filled 2023-07-25: qty 1000

## 2023-07-25 MED ORDER — SODIUM CHLORIDE 0.9 % IV SOLN
2.0000 g | INTRAVENOUS | Status: DC
  Administered 2023-07-26 – 2023-07-28 (×3): 2 g via INTRAVENOUS
  Filled 2023-07-25 (×3): qty 20

## 2023-07-25 MED ORDER — SODIUM CHLORIDE 0.9 % IV SOLN
2.0000 g | Freq: Once | INTRAVENOUS | Status: AC
  Administered 2023-07-25: 2 g via INTRAVENOUS
  Filled 2023-07-25: qty 20

## 2023-07-25 MED ORDER — SODIUM CHLORIDE 0.9 % IV BOLUS
1000.0000 mL | Freq: Once | INTRAVENOUS | Status: AC
  Administered 2023-07-25: 1000 mL via INTRAVENOUS

## 2023-07-25 MED ORDER — HEPARIN SODIUM (PORCINE) 1000 UNIT/ML IJ SOLN
INTRAMUSCULAR | Status: AC
  Administered 2023-07-25: 1000 [IU]
  Filled 2023-07-25: qty 3

## 2023-07-25 MED ORDER — DOCUSATE SODIUM 100 MG PO CAPS: 100.0000 mg | ORAL_CAPSULE | Freq: Two times a day (BID) | ORAL | Status: DC | PRN

## 2023-07-25 MED ORDER — IOHEXOL 350 MG/ML SOLN
100.0000 mL | Freq: Once | INTRAVENOUS | Status: AC | PRN
  Administered 2023-07-25: 75 mL via INTRAVENOUS

## 2023-07-25 MED ORDER — POLYETHYLENE GLYCOL 3350 17 G PO PACK: 17.0000 g | PACK | Freq: Every day | ORAL | Status: DC | PRN

## 2023-07-25 MED ORDER — DEXTROSE 5 % IV SOLN
250.0000 mg | INTRAVENOUS | Status: DC
  Administered 2023-07-26 – 2023-07-28 (×3): 250 mg via INTRAVENOUS
  Filled 2023-07-25 (×3): qty 2.5

## 2023-07-25 NOTE — ED Notes (Addendum)
 Pt returning from CT. Pt was very agitated and unwilling to remain still for necessary scans. Pt given valium per EDP.

## 2023-07-25 NOTE — ED Provider Notes (Addendum)
 Linda Deleon Provider Note   CSN: [id number] Arrival date & time: 07/25/23  0753     History  Chief Complaint  Patient presents with   Shortness of Breath   Altered Mental Status    Linda Deleon is a 43 y.o. female.  Patient arrives with family for some altered mental status shortness of breath not sleeping well acting somewhat erratic.  Family states last time she was at her normal was Friday.  Family states that the last 3 days and now today they have seen her may be talking to herself not acting fully appropriate increased work of breathing at times.  Voice has been hoarse but she is a smoker.  They do not know of any alcohol or drug use or trauma.  She has not ate or drank anything during this time mother states.  She is up all night and does not think she slept.  Patient can tell me her name can answer questions but somewhat difficult to get a history from her.  They tried to take her to the hospital yesterday but the wait was too long so they went home.  This was in [facility].  She has a history of hypertension kidney stones ADHD but they deny any history of bipolar or schizophrenia or drug use.  Patient does not really answer when I asked her about alcohol drugs hallucinations.  She is somewhat will laugh and giggle inappropriately when I asked her these questions.  Family states that she hasn't been sick recently.  She lives with her mother.  She does not work.  The history is provided by the patient.       Home Medications Prior to Admission medications   Medication Sig Start Date End Date Taking? Authorizing Provider  magic mouthwash (nystatin, lidocaine, diphenhydrAMINE, alum & mag hydroxide) suspension Swish and swallow 5 mLs 4 (four) times daily as needed for mouth pain. 12/06/22   Valinda Hoar, NP  metoprolol tartrate (LOPRESSOR) 50 MG tablet Take 1 tablet by mouth twice daily 02/24/23   Rema Fendt, NP       Allergies    Sulfamethoxazole-trimethoprim    Review of Systems   Review of Systems  Physical Exam Updated Vital Signs BP 111/68   Pulse (!) 118   Temp 97.9 F (36.6 C)   Resp (!) 21   Wt 54.8 kg   LMP 07/22/2023 (Approximate)   SpO2 96%   BMI 20.74 kg/m  Physical Exam Vitals and nursing note reviewed.  Constitutional:      Comments: Disheveled dry mucous membranes fidgety  HENT:     Head: Normocephalic and atraumatic.     Nose: Nose normal.     Mouth/Throat:     Comments: Dry tongue Eyes:     Extraocular Movements: Extraocular movements intact.     Conjunctiva/sclera: Conjunctivae normal.     Pupils: Pupils are equal, round, and reactive to light.  Neck:     Meningeal: Brudzinski's sign and Kernig's sign absent.  Cardiovascular:     Rate and Rhythm: Normal rate and regular rhythm.     Pulses: Normal pulses.     Heart sounds: Normal heart sounds. No murmur heard. Pulmonary:     Effort: Tachypnea present.     Breath sounds: No decreased breath sounds, wheezing or rhonchi.  Abdominal:     Palpations: Abdomen is soft.     Tenderness: There is no abdominal tenderness.  Musculoskeletal:  General: No swelling. Normal range of motion.     Cervical back: Normal range of motion and neck supple. No rigidity. No pain with movement. Normal range of motion.     Right lower leg: No edema.     Left lower leg: No edema.  Skin:    General: Skin is warm and dry.     Capillary Refill: Capillary refill takes less than 2 seconds.  Neurological:     General: No focal deficit present.     Cranial Nerves: No cranial nerve deficit.     Motor: No weakness.     Comments: She has normal strength and sensation, hoarse voice but can answer questions when I redirected her, pupils are equal and reactive, she can tell me her name she can identify a pen but she is hard to get the fully focus on an exam  Psychiatric:     Comments: She has an inappropriate affect, she laughs  intermittently on exam, seems to may be responding to internal stimuli, she will not answer if she is taking any alcohol or drugs or feels suicidal homicidal or if she is hearing any voices     ED Results / Procedures / Treatments   Labs (all labs ordered are listed, but only abnormal results are displayed) Labs Reviewed  CBC WITH DIFFERENTIAL/PLATELET - Abnormal; Notable for the following components:      Result Value   WBC 11.7 (*)    nRBC 0.3 (*)    Neutro Abs 10.2 (*)    All other components within normal limits  COMPREHENSIVE METABOLIC PANEL - Abnormal; Notable for the following components:   CO2 14 (*)    Glucose, Bld 109 (*)    Calcium 8.4 (*)    Anion gap 17 (*)    All other components within normal limits  RAPID URINE DRUG SCREEN, HOSP PERFORMED - Abnormal; Notable for the following components:   Benzodiazepines POSITIVE (*)    All other components within normal limits  BRAIN NATRIURETIC PEPTIDE - Abnormal; Notable for the following components:   B Natriuretic Peptide 120.2 (*)    All other components within normal limits  URINALYSIS, ROUTINE W REFLEX MICROSCOPIC - Abnormal; Notable for the following components:   APPearance CLOUDY (*)    Hgb urine dipstick LARGE (*)    Ketones, ur 80 (*)    Protein, ur 100 (*)    Leukocytes,Ua SMALL (*)    All other components within normal limits  TSH - Abnormal; Notable for the following components:   TSH 0.194 (*)    All other components within normal limits  URINALYSIS, MICROSCOPIC (REFLEX) - Abnormal; Notable for the following components:   Bacteria, UA FEW (*)    All other components within normal limits  SALICYLATE LEVEL - Abnormal; Notable for the following components:   Salicylate Lvl 92.5 (*)    All other components within normal limits  ACETAMINOPHEN LEVEL - Abnormal; Notable for the following components:   Acetaminophen (Tylenol), Serum <10 (*)    All other components within normal limits  CBG MONITORING, ED -  Abnormal; Notable for the following components:   Glucose-Capillary 113 (*)    All other components within normal limits  I-STAT VENOUS BLOOD GAS, ED - Abnormal; Notable for the following components:   pCO2, Ven 23.6 (*)    pO2, Ven 17 (*)    Bicarbonate 14.1 (*)    TCO2 15 (*)    Acid-base deficit 9.0 (*)    Calcium, Ion  0.82 (*)    Hemoglobin 15.6 (*)    All other components within normal limits  I-STAT VENOUS BLOOD GAS, ED - Abnormal; Notable for the following components:   pCO2, Ven 17.7 (*)    pO2, Ven 59 (*)    Bicarbonate 9.8 (*)    TCO2 10 (*)    Acid-base deficit 14.0 (*)    Calcium, Ion 0.76 (*)    HCT 35.0 (*)    Hemoglobin 11.9 (*)    All other components within normal limits  RESP PANEL BY RT-PCR (RSV, FLU A&B, COVID)  RVPGX2  CULTURE, BLOOD (ROUTINE X 2)  CULTURE, BLOOD (ROUTINE X 2)  LIPASE, BLOOD  ETHANOL  HCG, QUANTITATIVE, PREGNANCY  CK  AMMONIA  LACTIC ACID, PLASMA  T4, FREE  BLOOD GAS, VENOUS  OSMOLALITY  BETA-HYDROXYBUTYRIC ACID  TROPONIN I (HIGH SENSITIVITY)    EKG EKG Interpretation Date/Time:  Tuesday July 25 2023 08:32:39 EDT Ventricular Rate:  108 PR Interval:  133 QRS Duration:  94 QT Interval:  320 QTC Calculation: 429 R Axis:   78  Text Interpretation: Sinus tachycardia Right atrial enlargement Confirmed by Virgina Norfolk [phone]) on 07/25/2023 8:57:50 AM  Radiology CT Angio Chest PE W and/or Wo Contrast Result Date: 07/25/2023 CLINICAL DATA:  Pulmonary embolism (PE) suspected, high prob Cough with shortness of breath for 2 days.  Altered mental status. EXAM: CT ANGIOGRAPHY CHEST WITH CONTRAST TECHNIQUE: Multidetector CT imaging of the chest was performed using the standard protocol during bolus administration of intravenous contrast. Multiplanar CT image reconstructions and MIPs were obtained to evaluate the vascular anatomy. RADIATION DOSE REDUCTION: This exam was performed according to the departmental dose-optimization program which  includes automated exposure control, adjustment of the mA and/or kV according to patient size and/or use of iterative reconstruction technique. CONTRAST:  75mL OMNIPAQUE IOHEXOL 350 MG/ML SOLN COMPARISON:  Radiographs 07/25/2023 and 07/02/2006. FINDINGS: Technical note: Despite efforts by the technologist and patient, mild motion artifact is present on today's exam and could not be eliminated. This reduces exam sensitivity and specificity. Cardiovascular: The pulmonary arteries are adequately opacified with contrast to the level of the segmental branches. There is no evidence of acute pulmonary embolism. No acute systemic arterial abnormalities are identified. There is mild reflux of contrast into the hepatic veins. The heart size is normal. There is no pericardial effusion. Mediastinum/Nodes: There are no enlarged mediastinal, hilar or axillary lymph nodes. The thyroid gland, trachea and esophagus demonstrate no significant findings. Lungs/Pleura: No pleural effusion or pneumothorax. There are patchy ground-glass opacities anteriorly in both upper lobes which may be secondary to atypical infection or asymmetric edema. There is also mild involvement peripherally in the left lower lobe. No consolidation or suspicious pulmonary nodularity. Upper abdomen: No acute or significant findings are seen in the visualized upper abdomen. Probable 1.5 cm cyst in the upper pole of the left kidney for which no specific follow-up imaging is recommended. Musculoskeletal/Chest wall: There is no chest wall mass or suspicious osseous finding. Review of the MIP images confirms the above findings. IMPRESSION: 1. No evidence of acute pulmonary embolism or other acute vascular findings in the chest. 2. Patchy ground-glass opacities anteriorly in both upper lobes and peripherally in the left lower lobe, possibly secondary to atypical infection or asymmetric edema. No consolidation or pleural effusion. Electronically Signed   By: Carey Bullocks M.D.   On: 07/25/2023 12:20   CT HEAD WO CONTRAST ( ) Result Date: 07/25/2023 CLINICAL DATA:  Neurologic deficit. EXAM: CT HEAD  WITHOUT CONTRAST TECHNIQUE: Contiguous axial images were obtained from the base of the skull through the vertex without intravenous contrast. RADIATION DOSE REDUCTION: This exam was performed according to the departmental dose-optimization program which includes automated exposure control, adjustment of the mA and/or kV according to patient size and/or use of iterative reconstruction technique. COMPARISON:  None Available. FINDINGS: Brain: The ventricles and sulci appropriate size for patient's age. Small area of low attenuation involving the medial left cerebellar hemisphere suspicious for a small old infarct and encephalomalacia. There is no acute intracranial hemorrhage. No mass effect or midline shift. No extra-axial fluid collection. Vascular: No hyperdense vessel or unexpected calcification. Skull: Normal. Negative for fracture or focal lesion. Sinuses/Orbits: No acute finding. Other: None IMPRESSION: 1. No acute intracranial pathology. 2. 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 Electronically Signed   By: Elgie Collard M.D.   On: 07/25/2023 12:16   DG Chest Portable 1 View Result Date: 07/25/2023 CLINICAL DATA:  Cough and shortness of breath for the past 2 days. EXAM: PORTABLE CHEST 1 VIEW COMPARISON:  Chest x-ray dated July 02, 2006. FINDINGS: The heart size and mediastinal contours are within normal limits. Normal pulmonary vascularity. Patchy opacities in the right mid lung, likely in the upper lobe. No pleural effusion or pneumothorax. No acute osseous abnormality. IMPRESSION: 1. Right upper lobe pneumonia. Electronically Signed   By: Obie Dredge M.D.   On: 07/25/2023 09:47    Procedures .Critical Care  Performed by: Virgina Norfolk, DO Authorized by: Virgina Norfolk, DO   Critical care provider  statement:    Critical care time (minutes):  75   Critical care was necessary to treat or prevent imminent or life-threatening deterioration of the following conditions:  Sepsis and CNS failure or compromise   Critical care was time spent personally by me on the following activities:  Blood draw for specimens, development of treatment plan with patient or surrogate, evaluation of patient's response to treatment, discussions with primary provider, examination of patient, obtaining history from patient or surrogate, ordering and performing treatments and interventions, ordering and review of laboratory studies, ordering and review of radiographic studies, pulse oximetry, re-evaluation of patient's condition and review of old charts     Medications Ordered in ED Medications  LORazepam (ATIVAN) injection 2 mg (2 mg Intravenous Not Given 07/25/23 0949)  sterile water (preservative free) injection (  Not Given 07/25/23 1234)  vancomycin (VANCOCIN) IVPB 1000 mg/200 mL premix (1,000 mg Intravenous New Bag/Given 07/25/23 1319)  dextrose 10 % infusion ( Intravenous New Bag/Given 07/25/23 1321)  sodium bicarbonate 150 mEq in dextrose 5 % 1,150 mL infusion ( Intravenous Other (enter comment in med admin window) 07/25/23 1402)  sodium chloride 0.9 % bolus 1,000 mL (0 mLs Intravenous Stopped 07/25/23 1156)  haloperidol lactate (HALDOL) injection 5 mg (5 mg Intravenous Given 07/25/23 0946)  azithromycin (ZITHROMAX) 500 mg in sodium chloride 0.9 % 250 mL IVPB (0 mg Intravenous Stopped 07/25/23 1302)  cefTRIAXone (ROCEPHIN) 2 g in sodium chloride 0.9 % 100 mL IVPB (0 g Intravenous Stopped 07/25/23 1153)  iohexol (OMNIPAQUE) 350 MG/ML injection 100 mL (75 mLs Intravenous Contrast Given 07/25/23 1140)  diazepam (VALIUM) injection 2.5 mg (2.5 mg Intravenous Given 07/25/23 1135)  sodium chloride 0.9 % bolus 1,000 mL (0 mLs Intravenous Stopped 07/25/23 1312)  LORazepam (ATIVAN) injection 2 mg (2 mg Intravenous Given 07/25/23  1335)    ED Course/ Medical Decision Making/ A&P  Medical Decision Making Amount and/or Complexity of Data Reviewed Labs: ordered. Radiology: ordered.  Risk Prescription drug management. Decision regarding hospitalization.   Linda Deleon is here with altered mental status shortness of breath.  History of ADHD kidney stones hypertension.  Family states that the last 3 to 4 days she has not been acting quite right.  Seems to be talking to herself.  Somewhat difficult to get a history from the patient.  Family does not think there is any polysubstance abuse, no mental of history.  Patient laughs when I ask her about alcohol or drugs or mental health questions.  She denies any headache chest pain shortness of breath abdominal pain but she is somewhat hyperventilating on exam.  Not sure if she is responding to some internal stimuli.  She can move her head up and down without any issues.  She has no meningeal signs.  She is neurologically intact but she has a bizarre affect on exam.  Her vital signs are normal.  She has a hoarse voice but there is no signs of infection in the back of her throat.  They tried to go to a hospital in [place] yesterday to have her seen but the wait was too long and they left and took her back home.  She has been ambulating but acting erratic.  Differential is wide this could be polysubstance abuse, mental health process or could be organic medical problem.  Seems less likely to be stroke or head bleed but possible, seems less likely to be infectious process given she has no fever or symptoms such as nausea vomiting headache neck pain abdominal pain.  Sounds like she has not been eating or drinking.  She is unemployed at this time.  Will pursue broad workup with labs ammonia level CBC CMP lipase troponin chest x-ray CTA head and neck UDS urinalysis.  Will give IV fluids check blood gas check CBG and reevaluate.  EKG shows sinus rhythm.  No  ischemic changes.  Per my review and interpretation of labs thus far pH is 7.3.  Bicarb 14.  Suspect this could be from hyperventilation.  Lactic acid is normal.  White count 11.7.  She is little bit tachycardic and tachypneic and chest x-ray per radiology report shows right upper lobe pneumonia.  I wonder if this is more of an aspiration.  CK is normal.  Troponin normal.  Ultimately given mild tachycardia and tachypnea and pneumonia she does technically meet sepsis criteria.  Will add blood cultures and broad-spectrum IV antibiotics.  Will send her for CT of her chest as well to further evaluate the lungs.  Patient not really cooperating to sit still for CT or to provide urine sample so decision was made to give her Haldol and Ativan for sedation.  Per family she has not really slept in days I think this could be beneficial.  Patient still having a hard time sitting still for CTs.  I talked more with family question if may be there is been some increased Benadryl use to help with her sleep.  Wonder if there is some process with that going on but we will give her some Valium to see if we can get her sedated enough to get adequate CT scans.  Urine drug screen was overall unremarkable.  Benzos were positive but likely from Ativan given earlier by me.  Urinalysis negative for infection.  Clinically I do not have any concern for meningitis.  I think that this is either mental health related  or could be some other polysubstance process.  She does have a right-sided pneumonia.  Ultimately anticipate admitting her for further care but awaiting CT of her head CT of her chest.  CT scan of the head does not show any acute findings.  CT scan of the chest shows no acute pulmonary embolism but does show some patchy groundglass opacities in both upper lobes.  Ultimately troponin was normal.  BNP normal.  Alcohol level normal.  No significant electrolyte abnormality except for bicarb is 14.  Mild anion gap elevation at 17.   She continues to hyperventilate on exam, she is having a hard time sitting still for images and to maintain IV.  Ultimately I do not think she needs to be intubated but she is difficult to sedate.  I do not really have access to Precedex here.  I have given her some more benzodiazepines.  I am trying to avoid antipsychotics to avoid QTc prolongation and/or maybe exacerbate some other underlying process.  Does not really seem to be a serotonin syndrome.  Repeat blood gas shows a pH of 7.34, bicarb now down to 10.  Overall salicylate level pending Tylenol level pending I have added beta hydroxybutyrate, serum zones.  I talked with Dr. Celine Mans with ICU team.  Overall recommend an ED to ED transfer for them to evaluate at [facility] but I do think she benefit from being in the ICU given her level agitation and encephalopathy.  At this time I do not think she has meningitis but I am not can to be able to LP her without likely intubating her which would like to avoid.  I have added vancomycin for antibiotic she is already gotten Rocephin.  My suspicion that this is likely a toxidrome but will have her evaluated by the ICU team at [facility].  Please consult them when patient arrives.  I talked with Dr. Adela Lank he is aware of the patient being transferred.  I will start her on D10 infusion.  According the family she does not drink heavily or use substances that they are aware of.  I do not think she is having any active seizures as well. But would benefit eventually getting and EEG/MRI.  While patient was awaiting transport salicylate level Tylenol level resulted.  Salicylate levels elevated 92.5.  Talked to poison control.  Will start sodium bicarbonate infusion further recommendations.  Have updated the ICU team Dr. Celine Mans.  I will page nephrology team to make them aware.  Currently team here at time of this lab result and bicarb drip was started prior to them being transferred.  Overall patient critically ill.  This chart was  dictated using voice recognition software.  Despite best efforts to proofread,  errors can occur which can change the documentation meaning.     Final Clinical Impression(s) / ED Diagnoses Final diagnoses:  Altered mental status, unspecified altered mental status type  Community acquired pneumonia, unspecified laterality  Encephalopathy, unspecified type  Poisoning by salicylate, undetermined intent, initial encounter    Rx / DC Orders ED Discharge Orders     None         Virgina Norfolk, DO 07/25/23 1258    Virgina Norfolk, DO 07/25/23 1305    Virgina Norfolk, DO 07/25/23 1417

## 2023-07-25 NOTE — ED Notes (Signed)
 Altered x 3 days, SOB, unable to follow commands. CXR right upper lobe pneumonia. Afebrile, room air 96% on room air. Arrives in wrist restraints.   105 systolic.   20 LAC and right FA  Bicarb at 200/hr.  D10W 100/hr

## 2023-07-25 NOTE — ED Notes (Addendum)
 Nephrology MD at bedside

## 2023-07-25 NOTE — ED Notes (Signed)
 Fall risk armband Fall risk socks Fall risk sign on door

## 2023-07-25 NOTE — Progress Notes (Signed)
 Received call from bedside RN that HD has not started yet. Catheter has been in since 17:30 and there has been communication between bedside RN and HD unit discussing this fact.  I called HD unit again and spoke with Byrd Hesselbach and emphasized the critical importance of dialyzing this patient with life-threatening salicylate toxicity, for which the definitive treatment in this case is dialysis.    I have not as of this writing been informed of when dialysis will commence.    [facility] Director has been informed.  Bufford Buttner MD [employer]

## 2023-07-25 NOTE — ED Notes (Signed)
 Called [facility] for transfer to [facility] ED.  Spoke with Clydie Braun

## 2023-07-25 NOTE — Progress Notes (Signed)
 Completed 3 hours of dialysis. No fluid removal as ordered. VS stable. No significant events. RIJ uldall catheter saline locked and de-accessed using aseptic technique. Clamped both ports and caps replaced. Patient remains somnolent, restless at times. No signs of distress. Post tx VS stable. Handoff given to Lottie Dawson

## 2023-07-25 NOTE — ED Triage Notes (Addendum)
 SHOB, cough for 2 days. States she has not been sleeping well. Voice is hoarse. AMS, slower to respond. VAN negative   EDP at bedside

## 2023-07-25 NOTE — ED Notes (Signed)
 Family at bedside.

## 2023-07-25 NOTE — ED Notes (Signed)
CCM at bedside

## 2023-07-25 NOTE — Progress Notes (Signed)
 Linda Deleon from HD at bedside. iHD started and HCO3 stopped per nephro.

## 2023-07-25 NOTE — ED Notes (Signed)
 Attempted to call report to [facility] charge RN, no response

## 2023-07-25 NOTE — Progress Notes (Signed)
 [facility]Link Physician-Brief Progress Note Patient Name: Linda Deleon DOB: December 25, 1980 MRN: [id number]   Date of Service  07/25/2023  HPI/Events of Note  Patient presents with severe salicylate poisoning, HD catheter was placed, but there is a delay in initiating dialysis.  Asked to evaluate given her respiratory pattern consistent with severe acidosis  eICU Interventions  Reviewed the labs, hemodynamics, and at this point, no intervention is indicated.  She does emergently need dialysis and appears the team is working on making this happen logistical issues are impeding the process.  Continue observation.   4098 -reviewed labs.  Patient has severe hypokalemia, hypomagnesemia, and some hypocalcemia.  Will replete electrolytes.  Salicylates downtrending.  Respiratory status somewhat improved.  ABG still pending.  Will back off on bicarb drip for the time being given the severe electrolyte disturbances-reduced to 100/h  0329 -add NT suctioning as needed  Intervention Category Minor Interventions: Clinical assessment - ordering diagnostic tests  Linda Deleon 07/25/2023, 7:48 PM

## 2023-07-25 NOTE — ED Notes (Signed)
 MD made aware of patient's blood pressure. CCM to see

## 2023-07-25 NOTE — ED Notes (Signed)
 2nd set of cultures has 5 ml of blood in blue culture bottle, unable to obtain enough blood for all 10 ml or enough for brown culture bottle due to patient aggression.

## 2023-07-25 NOTE — Procedures (Signed)
 Central Venous Catheter Insertion Procedure Note  Linda Deleon  [id number]  Aug 19, 1980  Date:07/25/23  Time:5:31 PM   Provider Performing:Aubria Vanecek Humphrey Rolls   Procedure: Insertion of Non-tunneled Central Venous Catheter(36556)with US guidance (78295)    Indication(s) Hemodialysis  Consent Risks of the procedure as well as the alternatives and risks of each were explained to the patient and/or caregiver.  Consent for the procedure was obtained and is signed in the bedside chart  Anesthesia Topical only with 1% lidocaine   Timeout Verified patient identification, verified procedure, site/side was marked, verified correct patient position, special equipment/implants available, medications/allergies/relevant history reviewed, required imaging and test results available.  Sterile Technique Maximal sterile technique including full sterile barrier drape, hand hygiene, sterile gown, sterile gloves, mask, hair covering, sterile ultrasound probe cover (if used).  Procedure Description Area of catheter insertion was cleaned with chlorhexidine and draped in sterile fashion.   With real-time ultrasound guidance a HD catheter was placed into the right internal jugular vein.  Nonpulsatile blood flow and easy flushing noted in all ports.  The catheter was sutured in place and sterile dressing applied.  Complications/Tolerance None; patient tolerated the procedure well. Chest X-ray is ordered to verify placement for internal jugular or subclavian cannulation.  Chest x-ray is not ordered for femoral cannulation.  EBL Minimal  Specimen(s) None

## 2023-07-25 NOTE — H&P (Signed)
 NAMEKatherene Deleon, MRN:  [id number], DOB:  11-28-1980, LOS: 0 ADMISSION DATE:  07/25/2023, CONSULTATION DATE:  3/18 REFERRING MD:  Sol Passer CHIEF COMPLAINT: Altered mental status  History of Present Illness:  Linda Deleon is a 43 year old female with a history of hypertension, ADHD, who presented to the ED with altered mental status.  She lives with her mother who is at bedside.  History is provided by mother as patient is encephalopathic.  Episode started about 4 days ago, was not acting like himself, with difficulty hearing, and acute confusion.  No new medicines.  Had some mild nausea, no vomiting.  She has had a dry cough, no URI symptoms, no episodes of aspiration. Symptoms progressively worsened on the weekend, prompting them to go to the [facility] on Monday.  Left due to long wait times.  She has had mild appetite denies diarrhea, seizures or urinary symptoms.  No psychiatric disease.Notably, she has a history of back pain and headaches and takes Goody powder for it.  She has been picking increased amount due to ongoing back pain and headaches.  The ED, WBCs 13, VBG 7.38/23.6/14.1, elevated AG Ethanol and acetaminophen normal.  Salicylate 92. UDS + benzo(given in the ED.)  CT of chest with right upper lobe consolidation. BHB 0.89, UA with mild ketones, negative nitrites.  Pertinent  Medical History  Hypertension, ADHD.  Significant Hospital Events: Including procedures, antibiotic start and stop dates in addition to other pertinent events   3/18: Admitted to the ICU  Interim History / Subjective:  Central line placed for HD.  Objective   Blood pressure (!) 113/56, pulse (!) 126, temperature 100.2 F (37.9 C), temperature source Axillary, resp. rate 18, height 5\' 4"  (1.626 m), weight 54.8 kg, last menstrual period 07/22/2023, SpO2 97%.        Intake/Output Summary (Last 24 hours) at 07/25/2023 1516 Last data filed at 07/25/2023 1312 Gross per 24 hour  Intake  2347.34 ml  Output --  Net 2347.34 ml   Filed Weights   07/25/23 0816 07/25/23 1513  Weight: 54.8 kg 54.8 kg    Examination: General: Moderate distress, with deep labored breathing. HENT: Normal pupils. Lungs: Anterior lung Linda Deleon clear, no wheezes. Cardiovascular: Tachycardic rate, no murmurs. Abdomen: Soft, nontender, normal bowel sounds. Neuro: Not awake or alert, moving all extremities.  Resolved Hospital Problem list   N/a  Assessment & Plan:  Severe salicylate toxicity Elevated anion gap metabolic acidosis Encephalopathy Presenting with acute encephalopathy, tinnitus and nausea. Causes of acute encephalopathy includes metabolic vs. infectious vs. CNS vs. toxins. Normal head CT and nonfocal neurologic deficit rules out CNS etiology.  Normal electrolytes on BMP.  Elevated anion gap metabolic acidosis,s, severely elevated salicylate level, respiratory alkalosis and  consistent with severe salicylate toxicity; unintentional overdose, 2/2 to [facility] powder for back pain.  Other causes of elevated NAGMA including DKA, acetaminophen overdose, ethylene and methylene glycol ingestion less likely based on labs.  Nephrology consulted, patient will be dialyzed today at 630pm.  -Nephrology following, appreciate recs. -Central line placed for HD. - IV fluids - Sodium bicarbonate, stop bicarb drip when the patient is on dialysis. -Trend salicylate every 2 hours after dialysis to ensure levels are downtrending.  CAP  Has had nonproductive cough.  Respiratory viral negative.  Chest of chest with IV contrast showed patchy ground-glass opacities anteriorly in both upper lobes and peripherally in the left lower lobe, possibly secondary to atypical infection.  -Will treat with ceftriaxone and azithromycin.  Hypertension  Home regimen includes metoprolol, and lisinopril. -Hold home antihypertensives. Best Practice (right click and "Reselect all SmartList Selections" daily)   Diet/type:  NPO DVT prophylaxis: DOAC GI prophylaxis: H2B Lines: Central line Foley:  Yes, and it is still needed Code Status:  full code Last date of multidisciplinary goals of care discussion [none]  Labs   CBC: Recent Labs  Lab 07/25/23 0820 07/25/23 0853 07/25/23 1238  WBC 11.7*  --   --   NEUTROABS 10.2*  --   --   HGB 14.8 15.6* 11.9*  HCT 44.2 46.0 35.0*  MCV 91.5  --   --   PLT 233  --   --     Basic Metabolic Panel: Recent Labs  Lab 07/25/23 0820 07/25/23 0853 07/25/23 1238  NA 140 141 143  K 4.0 4.1 3.9  CL 109  --   --   CO2 14*  --   --   GLUCOSE 109*  --   --   BUN 20  --   --   CREATININE 0.92  --   --   CALCIUM 8.4*  --   --    GFR: Estimated Creatinine Clearance: 69.5 mL/min (by C-G formula based on SCr of 0.92 mg/dL). Recent Labs  Lab 07/25/23 0820 07/25/23 0859  WBC 11.7*  --   LATICACIDVEN  --  0.8    Liver Function Tests: Recent Labs  Lab 07/25/23 0820  AST 32  ALT 22  ALKPHOS 60  BILITOT 0.7  PROT 7.8  ALBUMIN 4.0   Recent Labs  Lab 07/25/23 0820  LIPASE 21   Recent Labs  Lab 07/25/23 0821  AMMONIA 17    ABG    Component Value Date/Time   HCO3 9.8 (L) 07/25/2023 1238   TCO2 10 (L) 07/25/2023 1238   ACIDBASEDEF 14.0 (H) 07/25/2023 1238   O2SAT 91 07/25/2023 1238     Coagulation Profile: No results for input(s): "INR", "PROTIME" in the last 168 hours.  Cardiac Enzymes: Recent Labs  Lab 07/25/23 0820  CKTOTAL 158    HbA1C: No results found for: "HGBA1C"  CBG: Recent Labs  Lab 07/25/23 0910  GLUCAP 113*    Review of Systems:   Negative as stated in HPI.  Past Medical History:  She,  has a past medical history of ADHD, Hypertension, and Kidney stone.   Surgical History:  History reviewed. No pertinent surgical history.   Social History:   reports that she has been smoking cigarettes. She started smoking about 2 years ago. She has a 1.1 pack-year smoking history. She has never used smokeless tobacco. She  reports that she does not currently use alcohol. She reports that she does not currently use drugs.   Family History:  Her family history is not on file.   Allergies Allergies  Allergen Reactions   Sulfamethoxazole-Trimethoprim Hives and Itching     Home Medications  Prior to Admission medications   Medication Sig Start Date End Date Taking? Authorizing Provider  magic mouthwash (nystatin, lidocaine, diphenhydrAMINE, alum & mag hydroxide) suspension Swish and swallow 5 mLs 4 (four) times daily as needed for mouth pain. 12/06/22   Valinda Hoar, NP  metoprolol tartrate (LOPRESSOR) 50 MG tablet Take 1 tablet by mouth twice daily 02/24/23   Rema Fendt, NP         Laretta Bolster, MD [facility] Internal Medicine Program - PGY-1 07/25/2023, 3:16 PM Pager# [phone]

## 2023-07-25 NOTE — ED Notes (Signed)
 In and out attempted by primary nurse, charge nurse and NT. Pt started to urinate before in and out was inserted

## 2023-07-25 NOTE — Consult Note (Signed)
 Reason for Consult: Salicylate toxicity Referring Physician: Dr. Lockie Mola  Chief Complaint: AMS  Assessment/Plan: Salicylate toxicity  Mixed metabolic acidosis-respiratory alkalosis - Salicylate level severely elevated to 92, and patient with altered mental status. Elevated anion gap with severely decreased pCO2, consistent with salicylate acid base disturbance. Additionally with borderline unstable vital signs, patient requires urgent dialysis.  Dialysis ASAP as soon as access is placed Dialysis catheter placement per CCM Continue NaHCO2 gtt 249ml/hr Strict I/O AM Salicylate level for rebound increase after dialysis CAP - Plan per primary team.     HPI: Linda Deleon is an 43 y.o. female with pertinent PMHx of HTN, Kidney Stones, ADHD presenting with AMS.  Presented to [facility] ED with family for AMS, difficulty sleeping and erratic behavior. Family has noticed her talking to herself over the past 3 days. Has not slept ate or drank that time.  Complete work-up performed in the ED to evaluate for cause of altered mental status and tachypnea. Ultimately met sepsis criteria for possible pneumonia, was started on empiric antibiotics. Work-up for AMS was grossly negative, until salicylate level return elevated to 92.5. Patient also had AG elevated to 17, with CO2 of 14, and normal LA. Tylenol level was normal. UA showed elevated ketones, protein, few bacteria, 11-20 WBC, 6-10 RBC.  ROS Unable to complete due to patient mental status.  Chemistry and CBC: Creatinine, Ser  Date/Time Value Ref Range Status  07/25/2023 08:20 AM 0.92 0.44 - 1.00 mg/dL Final  57/84/6962 95:28 PM 0.66 0.44 - 1.00 mg/dL Final   Recent Labs  Lab 07/25/23 0820 07/25/23 0853 07/25/23 1238  NA 140 141 143  K 4.0 4.1 3.9  CL 109  --   --   CO2 14*  --   --   GLUCOSE 109*  --   --   BUN 20  --   --   CREATININE 0.92  --   --   CALCIUM 8.4*  --   --    Recent Labs  Lab 07/25/23 0820  07/25/23 0853 07/25/23 1238  WBC 11.7*  --   --   NEUTROABS 10.2*  --   --   HGB 14.8 15.6* 11.9*  HCT 44.2 46.0 35.0*  MCV 91.5  --   --   PLT 233  --   --    Liver Function Tests: Recent Labs  Lab 07/25/23 0820  AST 32  ALT 22  ALKPHOS 60  BILITOT 0.7  PROT 7.8  ALBUMIN 4.0   Recent Labs  Lab 07/25/23 0820  LIPASE 21   Recent Labs  Lab 07/25/23 0821  AMMONIA 17   Cardiac Enzymes: Recent Labs  Lab 07/25/23 0820  CKTOTAL 158   Iron Studies: No results for input(s): "IRON", "TIBC", "TRANSFERRIN", "FERRITIN" in the last 72 hours. PT/INR: @LABRCNTIP (inr:5)  Xrays/Other Studies: ) Results for orders placed or performed during the hospital encounter of 07/25/23 (from the past 48 hours)  CBC with Differential     Status: Abnormal   Collection Time: 07/25/23  8:20 AM  Result Value Ref Range   WBC 11.7 (H) 4.0 - 10.5 K/uL   RBC 4.83 3.87 - 5.11 MIL/uL   Hemoglobin 14.8 12.0 - 15.0 g/dL   HCT 41.3 24.4 - 01.0 %   MCV 91.5 80.0 - 100.0 fL   MCH 30.6 26.0 - 34.0 pg   MCHC 33.5 30.0 - 36.0 g/dL   RDW 27.2 53.6 - 64.4 %   Platelets 233 150 - 400 K/uL  nRBC 0.3 (H) 0.0 - 0.2 %   Neutrophils Relative % 86 %   Neutro Abs 10.2 (H) 1.7 - 7.7 K/uL   Lymphocytes Relative 6 %   Lymphs Abs 0.7 0.7 - 4.0 K/uL   Monocytes Relative 7 %   Monocytes Absolute 0.8 0.1 - 1.0 K/uL   Eosinophils Relative 0 %   Eosinophils Absolute 0.0 0.0 - 0.5 K/uL   Basophils Relative 1 %   Basophils Absolute 0.1 0.0 - 0.1 K/uL   Immature Granulocytes 0 %   Abs Immature Granulocytes 0.05 0.00 - 0.07 K/uL    Comment: Performed at [facility], [address] [place], [place] [address]  Comprehensive metabolic panel     Status: Abnormal   Collection Time: 07/25/23  8:20 AM  Result Value Ref Range   Sodium 140 135 - 145 mmol/L   Potassium 4.0 3.5 - 5.1 mmol/L   Chloride 109 98 - 111 mmol/L   CO2 14 (L) 22 - 32 mmol/L   Glucose, Bld 109 (H) 70 - 99 mg/dL    Comment: Glucose  reference range applies only to samples taken after fasting for at least 8 hours.   BUN 20 6 - 20 mg/dL   Creatinine, Ser 1.02 0.44 - 1.00 mg/dL   Calcium 8.4 (L) 8.9 - 10.3 mg/dL   Total Protein 7.8 6.5 - 8.1 g/dL   Albumin 4.0 3.5 - 5.0 g/dL   AST 32 15 - 41 U/L   ALT 22 0 - 44 U/L   Alkaline Phosphatase 60 38 - 126 U/L   Total Bilirubin 0.7 0.0 - 1.2 mg/dL   GFR, Estimated >72 >53 mL/min    Comment: (NOTE) Calculated using the CKD-EPI Creatinine Equation (2021)    Anion gap 17 (H) 5 - 15    Comment: Performed at [facility], [address] [place], [place] [address]  Lipase, blood     Status: None   Collection Time: 07/25/23  8:20 AM  Result Value Ref Range   Lipase 21 11 - 51 U/L    Comment: Performed at [facility], [address] [place], [place] [address]  Ethanol     Status: None   Collection Time: 07/25/23  8:20 AM  Result Value Ref Range   Alcohol, Ethyl (B) <10 <10 mg/dL    Comment: (NOTE) Lowest detectable limit for serum alcohol is 10 mg/dL.  For medical purposes only. Performed at [facility], [address] [place], [place] [address]   Troponin I (High Sensitivity)     Status: None   Collection Time: 07/25/23  8:20 AM  Result Value Ref Range   Troponin I (High Sensitivity) 5 <18 ng/L    Comment: (NOTE) Elevated high sensitivity troponin I (hsTnI) values and significant  changes across serial measurements may suggest ACS but many other  chronic and acute conditions are known to elevate hsTnI results.  Refer to the "Links" section for chest pain algorithms and additional  guidance. Performed at [facility], [address] [place], [place] [address]   Brain natriuretic peptide     Status: Abnormal   Collection Time: 07/25/23  8:20 AM  Result Value Ref Range   B Natriuretic Peptide 120.2 (H) 0.0 - 100.0 pg/mL    Comment: Performed at [facility], [address] [place], [place] [address]  CK      Status: None  Collection Time: 07/25/23  8:20 AM  Result Value Ref Range   Total CK 158 38 - 234 U/L    Comment: Performed at [facility], [address] [place], [place] [address]  hCG, quantitative, pregnancy     Status: None   Collection Time: 07/25/23  8:21 AM  Result Value Ref Range   hCG, Beta Chain, Quant, S <1 <5 mIU/mL    Comment:          GEST. AGE      CONC.  (mIU/mL)   <=1 WEEK        5 - 50     2 WEEKS       50 - 500     3 WEEKS       100 - 10,000     4 WEEKS     1,000 - 30,000     5 WEEKS     3,500 - 115,000   6-8 WEEKS     12,000 - 270,000    12 WEEKS     15,000 - 220,000        FEMALE AND NON-PREGNANT FEMALE:     LESS THAN 5 mIU/mL Performed at [facility], [address] [place], [place] [address]   Resp panel by RT-PCR (RSV, Flu A&B, Covid) Anterior Nasal Swab     Status: None   Collection Time: 07/25/23  8:21 AM   Specimen: Anterior Nasal Swab  Result Value Ref Range   SARS Coronavirus 2 by RT PCR NEGATIVE NEGATIVE    Comment: (NOTE) SARS-CoV-2 target nucleic acids are NOT DETECTED.  The SARS-CoV-2 RNA is generally detectable in upper respiratory specimens during the acute phase of infection. The lowest concentration of SARS-CoV-2 viral copies this assay can detect is 138 copies/mL. A negative result does not preclude SARS-Cov-2 infection and should not be used as the sole basis for treatment or other patient management decisions. A negative result may occur with  improper specimen collection/handling, submission of specimen other than nasopharyngeal swab, presence of viral mutation(s) within the areas targeted by this assay, and inadequate number of viral copies(<138 copies/mL). A negative result must be combined with clinical observations, patient history, and epidemiological information. The expected result is Negative.  Fact Sheet for Patients:  BloggerCourse.com  Fact Sheet for Healthcare  Providers:  SeriousBroker.it  This test is no t yet approved or cleared by the [place] FDA and  has been authorized for detection and/or diagnosis of SARS-CoV-2 by FDA under an Emergency Use Authorization (EUA). This EUA will remain  in effect (meaning this test can be used) for the duration of the COVID-19 declaration under Section 564(b)(1) of the Act, 21 U.S.C.section 360bbb-3(b)(1), unless the authorization is terminated  or revoked sooner.       Influenza A by PCR NEGATIVE NEGATIVE   Influenza B by PCR NEGATIVE NEGATIVE    Comment: (NOTE) The Xpert Xpress SARS-CoV-2/FLU/RSV plus assay is intended as an aid in the diagnosis of influenza from Nasopharyngeal swab specimens and should not be used as a sole basis for treatment. Nasal washings and aspirates are unacceptable for Xpert Xpress SARS-CoV-2/FLU/RSV testing.  Fact Sheet for Patients: BloggerCourse.com  Fact Sheet for Healthcare Providers: SeriousBroker.it  This test is not yet approved or cleared by the [place] FDA and has been authorized for detection and/or diagnosis of SARS-CoV-2 by FDA under an Emergency Use Authorization (EUA). This EUA will remain in effect (meaning this test can be  used) for the duration of the COVID-19 declaration under Section 564(b)(1) of the Act, 21 U.S.C. section 360bbb-3(b)(1), unless the authorization is terminated or revoked.     Resp Syncytial Virus by PCR NEGATIVE NEGATIVE    Comment: (NOTE) Fact Sheet for Patients: BloggerCourse.com  Fact Sheet for Healthcare Providers: SeriousBroker.it  This test is not yet approved or cleared by the [place] FDA and has been authorized for detection and/or diagnosis of SARS-CoV-2 by FDA under an Emergency Use Authorization (EUA). This EUA will remain in effect (meaning this test can be used) for the  duration of the COVID-19 declaration under Section 564(b)(1) of the Act, 21 U.S.C. section 360bbb-3(b)(1), unless the authorization is terminated or revoked.  Performed at [facility], [address] [place], [place] [address]   TSH     Status: Abnormal   Collection Time: 07/25/23  8:21 AM  Result Value Ref Range   TSH 0.194 (L) 0.350 - 4.500 uIU/mL    Comment: Performed by a 3rd Generation assay with a functional sensitivity of <=0.01 uIU/mL. Performed at [facility], [address] [place], [place] [address]   T4, free     Status: Abnormal   Collection Time: 07/25/23  8:21 AM  Result Value Ref Range   Free T4 0.57 (L) 0.61 - 1.12 ng/dL    Comment: (NOTE) Biotin ingestion may interfere with free T4 tests. If the results are inconsistent with the TSH level, previous test results, or the clinical presentation, then consider biotin interference. If needed, order repeat testing after stopping biotin. Performed at [facility], [address] [place], [place] [address]   Ammonia     Status: None   Collection Time: 07/25/23  8:21 AM  Result Value Ref Range   Ammonia 17 9 - 35 umol/L    Comment: Performed at [facility], [address] [place], [place] [address]  I-Stat venous blood gas, ED     Status: Abnormal   Collection Time: 07/25/23  8:53 AM  Result Value Ref Range   pH, Ven 7.381 7.25 - 7.43   pCO2, Ven 23.6 (L) 44 - 60 mmHg   pO2, Ven 17 (LL) 32 - 45 mmHg   Bicarbonate 14.1 (L) 20.0 - 28.0 mmol/L   TCO2 15 (L) 22 - 32 mmol/L   O2 Saturation 27 %   Acid-base deficit 9.0 (H) 0.0 - 2.0 mmol/L   Sodium 141 135 - 145 mmol/L   Potassium 4.1 3.5 - 5.1 mmol/L   Calcium, Ion 0.82 (LL) 1.15 - 1.40 mmol/L   HCT 46.0 36.0 - 46.0 %   Hemoglobin 15.6 (H) 12.0 - 15.0 g/dL   Patient temperature 65.7 F    Collection site IV start    Drawn by Nurse    Sample type VENOUS    Comment NOTIFIED PHYSICIAN   Lactic acid, plasma     Status: None    Collection Time: 07/25/23  8:59 AM  Result Value Ref Range   Lactic Acid, Venous 0.8 0.5 - 1.9 mmol/L    Comment: Performed at [facility], [address] [place], [place] [address]  POC CBG, ED     Status: Abnormal   Collection Time: 07/25/23  9:10 AM  Result Value Ref Range   Glucose-Capillary 113 (H) 70 - 99 mg/dL    Comment: Glucose reference range applies only to samples taken after fasting for at least 8 hours.  Rapid  urine drug screen (hospital performed)     Status: Abnormal   Collection Time: 07/25/23 10:00 AM  Result Value Ref Range   Opiates NONE DETECTED NONE DETECTED   Cocaine NONE DETECTED NONE DETECTED   Benzodiazepines POSITIVE (A) NONE DETECTED   Amphetamines NONE DETECTED NONE DETECTED   Tetrahydrocannabinol NONE DETECTED NONE DETECTED   Barbiturates NONE DETECTED NONE DETECTED    Comment: (NOTE) DRUG SCREEN FOR MEDICAL PURPOSES ONLY.  IF CONFIRMATION IS NEEDED FOR ANY PURPOSE, NOTIFY LAB WITHIN 5 DAYS.  LOWEST DETECTABLE LIMITS FOR URINE DRUG SCREEN Drug Class                     Cutoff (ng/mL) Amphetamine and metabolites    1000 Barbiturate and metabolites    200 Benzodiazepine                 200 Opiates and metabolites        300 Cocaine and metabolites        300 THC                            50 Performed at [facility], [address] [place], [place] [address]   Urinalysis, Routine w reflex microscopic -Urine, Clean Catch     Status: Abnormal   Collection Time: 07/25/23 10:00 AM  Result Value Ref Range   Color, Urine YELLOW YELLOW   APPearance CLOUDY (A) CLEAR   Specific Gravity, Urine >=1.030 1.005 - 1.030   pH 5.0 5.0 - 8.0   Glucose, UA NEGATIVE NEGATIVE mg/dL   Hgb urine dipstick LARGE (A) NEGATIVE   Bilirubin Urine NEGATIVE NEGATIVE   Ketones, ur 80 (A) NEGATIVE mg/dL   Protein, ur 782 (A) NEGATIVE mg/dL   Nitrite NEGATIVE NEGATIVE   Leukocytes,Ua SMALL (A) NEGATIVE    Comment: Performed at [facility], [address] [place], [place] [address]  Urinalysis, Microscopic (reflex)     Status: Abnormal   Collection Time: 07/25/23 10:00 AM  Result Value Ref Range   RBC / HPF 6-10 0 - 5 RBC/hpf   WBC, UA 11-20 0 - 5 WBC/hpf   Bacteria, UA FEW (A) NONE SEEN   Squamous Epithelial / HPF 0-5 0 - 5 /HPF   WBC Clumps PRESENT    Hyaline Casts, UA PRESENT    Amorphous Alis PRESENT     Comment: Performed at [facility], [address] [place], [place] [address]  Salicylate level     Status: Abnormal   Collection Time: 07/25/23 12:17 PM  Result Value Ref Range   Salicylate Lvl 92.5 (HH) 7.0 - 30.0 mg/dL    Comment: CRITICAL RESULT CALLED TO, READ BACK BY AND VERIFIED WITH ALI NOAH RN AT 1342 ON 07/25/23 BY [email or web address] Performed at [facility], [address] [place], [place] [address]   Acetaminophen level     Status: Abnormal   Collection Time: 07/25/23 12:17 PM  Result Value Ref Range   Acetaminophen (Tylenol), Serum <10 (L) 10 - 30 ug/mL    Comment: (NOTE) Therapeutic concentrations vary significantly. A range of 10-30 ug/mL  may be an effective concentration for many patients. However, some  are best treated at concentrations outside of this range. Acetaminophen concentrations >150 ug/mL at 4 hours after ingestion  and >50 ug/mL at 12 hours after ingestion are often associated with  toxic reactions.  Performed at [facility], [address] [place], [place] [address]   I-Stat venous blood gas, ED     Status: Abnormal   Collection Time: 07/25/23 12:38 PM  Result Value Ref Range   pH, Ven 7.346 7.25 - 7.43   pCO2, Ven 17.7 (LL) 44 - 60 mmHg   pO2, Ven 59 (H) 32 - 45 mmHg   Bicarbonate 9.8 (L) 20.0 - 28.0 mmol/L   TCO2 10 (L) 22 - 32 mmol/L   O2 Saturation 91 %   Acid-base deficit 14.0 (H) 0.0 - 2.0 mmol/L   Sodium 143 135 - 145 mmol/L   Potassium 3.9 3.5 - 5.1 mmol/L   Calcium, Ion 0.76 (LL) 1.15 - 1.40 mmol/L   HCT 35.0 (L) 36.0 - 46.0  %   Hemoglobin 11.9 (L) 12.0 - 15.0 g/dL   Patient temperature 84.6 F    Collection site IV start    Drawn by Nurse    Sample type VENOUS    Comment NOTIFIED PHYSICIAN    CT Angio Chest PE W and/or Wo Contrast Result Date: 07/25/2023 CLINICAL DATA:  Pulmonary embolism (PE) suspected, high prob Cough with shortness of breath for 2 days.  Altered mental status. EXAM: CT ANGIOGRAPHY CHEST WITH CONTRAST TECHNIQUE: Multidetector CT imaging of the chest was performed using the standard protocol during bolus administration of intravenous contrast. Multiplanar CT image reconstructions and MIPs were obtained to evaluate the vascular anatomy. RADIATION DOSE REDUCTION: This exam was performed according to the departmental dose-optimization program which includes automated exposure control, adjustment of the mA and/or kV according to patient size and/or use of iterative reconstruction technique. CONTRAST:  75mL OMNIPAQUE IOHEXOL 350 MG/ML SOLN COMPARISON:  Radiographs 07/25/2023 and 07/02/2006. FINDINGS: Technical note: Despite efforts by the technologist and patient, mild motion artifact is present on today's exam and could not be eliminated. This reduces exam sensitivity and specificity. Cardiovascular: The pulmonary arteries are adequately opacified with contrast to the level of the segmental branches. There is no evidence of acute pulmonary embolism. No acute systemic arterial abnormalities are identified. There is mild reflux of contrast into the hepatic veins. The heart size is normal. There is no pericardial effusion. Mediastinum/Nodes: There are no enlarged mediastinal, hilar or axillary lymph nodes. The thyroid gland, trachea and esophagus demonstrate no significant findings. Lungs/Pleura: No pleural effusion or pneumothorax. There are patchy ground-glass opacities anteriorly in both upper lobes which may be secondary to atypical infection or asymmetric edema. There is also mild involvement peripherally in the  left lower lobe. No consolidation or suspicious pulmonary nodularity. Upper abdomen: No acute or significant findings are seen in the visualized upper abdomen. Probable 1.5 cm cyst in the upper pole of the left kidney for which no specific follow-up imaging is recommended. Musculoskeletal/Chest wall: There is no chest wall mass or suspicious osseous finding. Review of the MIP images confirms the above findings. IMPRESSION: 1. No evidence of acute pulmonary embolism or other acute vascular findings in the chest. 2. Patchy ground-glass opacities anteriorly in both upper lobes and peripherally in the left lower lobe, possibly secondary to atypical infection or asymmetric edema. No consolidation or pleural effusion. Electronically Signed   By: Carey Bullocks M.D.   On: 07/25/2023 12:20   CT HEAD WO CONTRAST ( ) Result Date: 07/25/2023 CLINICAL DATA:  Neurologic deficit. EXAM: CT HEAD WITHOUT CONTRAST TECHNIQUE: Contiguous axial images were obtained from the base of the skull through the vertex without intravenous contrast. RADIATION DOSE REDUCTION: This exam  was performed according to the departmental dose-optimization program which includes automated exposure control, adjustment of the mA and/or kV according to patient size and/or use of iterative reconstruction technique. COMPARISON:  None Available. FINDINGS: Brain: The ventricles and sulci appropriate size for patient's age. Small area of low attenuation involving the medial left cerebellar hemisphere suspicious for a small old infarct and encephalomalacia. There is no acute intracranial hemorrhage. No mass effect or midline shift. No extra-axial fluid collection. Vascular: No hyperdense vessel or unexpected calcification. Skull: Normal. Negative for fracture or focal lesion. Sinuses/Orbits: No acute finding. Other: None IMPRESSION: 1. No acute intracranial pathology. 2. 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  Electronically Signed   By: Elgie Collard M.D.   On: 07/25/2023 12:16   DG Chest Portable 1 View Result Date: 07/25/2023 CLINICAL DATA:  Cough and shortness of breath for the past 2 days. EXAM: PORTABLE CHEST 1 VIEW COMPARISON:  Chest x-ray dated July 02, 2006. FINDINGS: The heart size and mediastinal contours are within normal limits. Normal pulmonary vascularity. Patchy opacities in the right mid lung, likely in the upper lobe. No pleural effusion or pneumothorax. No acute osseous abnormality. IMPRESSION: 1. Right upper lobe pneumonia. Electronically Signed   By: Obie Dredge M.D.   On: 07/25/2023 09:47    PMH:   Past Medical History:  Diagnosis Date   ADHD    Hypertension    Kidney stone     PSH:  History reviewed. No pertinent surgical history.  Allergies:  Allergies  Allergen Reactions   Sulfamethoxazole-Trimethoprim Hives and Itching    Medications:   Prior to Admission medications   Medication Sig Start Date End Date Taking? Authorizing Provider  magic mouthwash (nystatin, lidocaine, diphenhydrAMINE, alum & mag hydroxide) suspension Swish and swallow 5 mLs 4 (four) times daily as needed for mouth pain. 12/06/22   Valinda Hoar, NP  metoprolol tartrate (LOPRESSOR) 50 MG tablet Take 1 tablet by mouth twice daily 02/24/23   Rema Fendt, NP    Discontinued Meds:   Medications Discontinued During This Encounter  Medication Reason   ziprasidone (GEODON) injection 20 mg    LORazepam (ATIVAN) injection 1 mg    ziprasidone (GEODON) injection 20 mg     Social History:  reports that she has been smoking cigarettes. She started smoking about 2 years ago. She has a 1.1 pack-year smoking history. She has never used smokeless tobacco. She reports that she does not currently use alcohol. She reports that she does not currently use drugs.  Family History:  History reviewed. No pertinent family history.  Blood pressure 111/68, pulse (!) 118, temperature 97.9 F (36.6  C), resp. rate (!) 21, weight 54.8 kg, last menstrual period 07/22/2023, SpO2 96%. General: Moderate distress, acutely ill appearing Neuro: Responds to tactile stimuli, PERRL, Moving extremities spontaneously, Non-verbal Cardiovascular: RRR, no murmurs, no peripheral edema Respiratory: tachypnic, gasping, accessory muscle use, CTAB Abdomen: soft, NTTP, no rebound or guarding Extremities: Moving all 4 extremities equally      Celine Mans, MD, PGY-2 [facility] 2:28 PM 07/25/2023

## 2023-07-25 NOTE — Progress Notes (Addendum)
 For urgent dialysis at bedside. Received patient somnolent, restless at times. Received on 1:1 sitter at bedside, on bilateral hand mittens/wrist restraints. Tachypneic and Sinus tach on the monitor. HD order/consent verified. Appropriate PPE worn by this RN (mask,gown,eyeshield,gloves) VS taken and recorded,  HD machine checked. RIJ Uldall catheter accessed aseptically without issue. To run for 3 hours with 0 (zero) fluid removal. To use 4K+ dialysate bath for K level of 3.9. HD started per order at 2033. Plan of care ongoing.

## 2023-07-25 NOTE — ED Notes (Signed)
 Informed consent for dialysis signed by mother due to patient's altered mental status and witnessed by this RN. Consent at bedside.

## 2023-07-25 NOTE — ED Notes (Signed)
Nephro MD at bedside

## 2023-07-25 NOTE — Progress Notes (Signed)
 ED Pharmacy Antibiotic Sign Off An antibiotic consult was received from an ED provider for vancomycin per pharmacy dosing for sepsis. A chart review was completed to assess appropriateness.   The following one time order(s) were placed:  Vancomycin 1g IV x 1  Further antibiotic and/or antibiotic pharmacy consults should be ordered by the admitting provider if indicated.   Thank you for allowing pharmacy to be a part of this patient's care.   Jhania Etherington, Drake Leach, [facility]  Clinical Pharmacist 07/25/23 12:48 PM

## 2023-07-25 NOTE — ED Notes (Signed)
 Called [facility] and spoke to [place] to arrange transport for ED-ED transfer to [facility].

## 2023-07-26 ENCOUNTER — Inpatient Hospital Stay (HOSPITAL_COMMUNITY)

## 2023-07-26 DIAGNOSIS — J9602 Acute respiratory failure with hypercapnia: Secondary | ICD-10-CM

## 2023-07-26 DIAGNOSIS — E872 Acidosis, unspecified: Secondary | ICD-10-CM | POA: Diagnosis not present

## 2023-07-26 DIAGNOSIS — R509 Fever, unspecified: Secondary | ICD-10-CM

## 2023-07-26 DIAGNOSIS — R569 Unspecified convulsions: Secondary | ICD-10-CM | POA: Diagnosis not present

## 2023-07-26 DIAGNOSIS — G9340 Encephalopathy, unspecified: Secondary | ICD-10-CM | POA: Diagnosis not present

## 2023-07-26 DIAGNOSIS — J9601 Acute respiratory failure with hypoxia: Secondary | ICD-10-CM | POA: Diagnosis not present

## 2023-07-26 DIAGNOSIS — Z452 Encounter for adjustment and management of vascular access device: Secondary | ICD-10-CM | POA: Diagnosis not present

## 2023-07-26 DIAGNOSIS — R918 Other nonspecific abnormal finding of lung field: Secondary | ICD-10-CM | POA: Diagnosis not present

## 2023-07-26 DIAGNOSIS — T39094A Poisoning by salicylates, undetermined, initial encounter: Secondary | ICD-10-CM | POA: Diagnosis not present

## 2023-07-26 DIAGNOSIS — R0902 Hypoxemia: Secondary | ICD-10-CM | POA: Diagnosis not present

## 2023-07-26 DIAGNOSIS — R4182 Altered mental status, unspecified: Secondary | ICD-10-CM

## 2023-07-26 LAB — URINALYSIS, ROUTINE W REFLEX MICROSCOPIC
Bilirubin Urine: NEGATIVE
Glucose, UA: NEGATIVE mg/dL
Ketones, ur: NEGATIVE mg/dL
Leukocytes,Ua: NEGATIVE
Nitrite: NEGATIVE
Protein, ur: NEGATIVE mg/dL
Specific Gravity, Urine: 1.005 (ref 1.005–1.030)
pH: 8 (ref 5.0–8.0)

## 2023-07-26 LAB — BASIC METABOLIC PANEL
Anion gap: 5 (ref 5–15)
Anion gap: 6 (ref 5–15)
Anion gap: 7 (ref 5–15)
BUN: <5 mg/dL — ABNORMAL LOW (ref 6–20)
BUN: <5 mg/dL — ABNORMAL LOW (ref 6–20)
BUN: <5 mg/dL — ABNORMAL LOW (ref 6–20)
CO2: 31 mmol/L (ref 22–32)
CO2: 28 mmol/L (ref 22–32)
CO2: 28 mmol/L (ref 22–32)
Calcium: 6.9 mg/dL — ABNORMAL LOW (ref 8.9–10.3)
Calcium: 7 mg/dL — ABNORMAL LOW (ref 8.9–10.3)
Calcium: 7.4 mg/dL — ABNORMAL LOW (ref 8.9–10.3)
Chloride: 97 mmol/L — ABNORMAL LOW (ref 98–111)
Chloride: 100 mmol/L (ref 98–111)
Chloride: 97 mmol/L — ABNORMAL LOW (ref 98–111)
Creatinine, Ser: 0.43 mg/dL — ABNORMAL LOW (ref 0.44–1.00)
Creatinine, Ser: 0.5 mg/dL (ref 0.44–1.00)
Creatinine, Ser: 0.35 mg/dL — ABNORMAL LOW (ref 0.44–1.00)
GFR, Estimated: >60 mL/min (ref >60)
GFR, Estimated: >60 mL/min (ref >60)
GFR, Estimated: >60 mL/min (ref >60)
Glucose, Bld: 179 mg/dL — ABNORMAL HIGH (ref 70–99)
Glucose, Bld: 117 mg/dL — ABNORMAL HIGH (ref 70–99)
Glucose, Bld: 124 mg/dL — ABNORMAL HIGH (ref 70–99)
Potassium: 2.4 mmol/L — CL (ref 3.5–5.1)
Potassium: 3.3 mmol/L — ABNORMAL LOW (ref 3.5–5.1)
Potassium: 3.4 mmol/L — ABNORMAL LOW (ref 3.5–5.1)
Sodium: 133 mmol/L — ABNORMAL LOW (ref 135–145)
Sodium: 134 mmol/L — ABNORMAL LOW (ref 135–145)
Sodium: 132 mmol/L — ABNORMAL LOW (ref 135–145)

## 2023-07-26 LAB — POCT I-STAT 7, (LYTES, BLD GAS, ICA,H+H)
Acid-Base Excess: 6 mmol/L — ABNORMAL HIGH (ref 0.0–2.0)
Acid-Base Excess: 6 mmol/L — ABNORMAL HIGH (ref 0.0–2.0)
Bicarbonate: 30.6 mmol/L — ABNORMAL HIGH (ref 20.0–28.0)
Bicarbonate: 32.2 mmol/L — ABNORMAL HIGH (ref 20.0–28.0)
Calcium, Ion: 1.05 mmol/L — ABNORMAL LOW (ref 1.15–1.40)
Calcium, Ion: 1.09 mmol/L — ABNORMAL LOW (ref 1.15–1.40)
HCT: 33 % — ABNORMAL LOW (ref 36.0–46.0)
HCT: 33 % — ABNORMAL LOW (ref 36.0–46.0)
Hemoglobin: 11.2 g/dL — ABNORMAL LOW (ref 12.0–15.0)
Hemoglobin: 11.2 g/dL — ABNORMAL LOW (ref 12.0–15.0)
O2 Saturation: 82 %
O2 Saturation: 91 %
Patient temperature: 101.1
Patient temperature: 38.1
Potassium: 2.6 mmol/L — CL (ref 3.5–5.1)
Potassium: 3.5 mmol/L (ref 3.5–5.1)
Sodium: 135 mmol/L (ref 135–145)
Sodium: 132 mmol/L — ABNORMAL LOW (ref 135–145)
TCO2: 32 mmol/L (ref 22–32)
TCO2: 34 mmol/L — ABNORMAL HIGH (ref 22–32)
pCO2 arterial: 48.9 mmHg — ABNORMAL HIGH (ref 32–48)
pCO2 arterial: 56.9 mmHg — ABNORMAL HIGH (ref 32–48)
pH, Arterial: 7.411 (ref 7.35–7.45)
pH, Arterial: 7.366 (ref 7.35–7.45)
pO2, Arterial: 51 mmHg — ABNORMAL LOW (ref 83–108)
pO2, Arterial: 70 mmHg — ABNORMAL LOW (ref 83–108)

## 2023-07-26 LAB — MAGNESIUM
Magnesium: 1.6 mg/dL — ABNORMAL LOW (ref 1.7–2.4)
Magnesium: 2.5 mg/dL — ABNORMAL HIGH (ref 1.7–2.4)
Magnesium: 1.8 mg/dL (ref 1.7–2.4)

## 2023-07-26 LAB — CBC
HCT: 32 % — ABNORMAL LOW (ref 36.0–46.0)
Hemoglobin: 11 g/dL — ABNORMAL LOW (ref 12.0–15.0)
MCH: 31.1 pg (ref 26.0–34.0)
MCHC: 34.4 g/dL (ref 30.0–36.0)
MCV: 90.4 fL (ref 80.0–100.0)
Platelets: 162 10*3/uL (ref 150–400)
RBC: 3.54 MIL/uL — ABNORMAL LOW (ref 3.87–5.11)
RDW: 14.8 % (ref 11.5–15.5)
WBC: 13.9 10*3/uL — ABNORMAL HIGH (ref 4.0–10.5)
nRBC: 0.1 % (ref 0.0–0.2)

## 2023-07-26 LAB — HEPATITIS B SURFACE ANTIBODY, QUANTITATIVE: Hep B S AB Quant (Post): <3.5 m[IU]/mL — ABNORMAL LOW

## 2023-07-26 LAB — GLUCOSE, CAPILLARY
Glucose-Capillary: 130 mg/dL — ABNORMAL HIGH (ref 70–99)
Glucose-Capillary: 132 mg/dL — ABNORMAL HIGH (ref 70–99)
Glucose-Capillary: 117 mg/dL — ABNORMAL HIGH (ref 70–99)

## 2023-07-26 LAB — PHOSPHORUS
Phosphorus: <1 mg/dL — CL (ref 2.5–4.6)
Phosphorus: 2.2 mg/dL — ABNORMAL LOW (ref 2.5–4.6)
Phosphorus: 2 mg/dL — ABNORMAL LOW (ref 2.5–4.6)

## 2023-07-26 LAB — CORTISOL-AM, BLOOD: Cortisol - AM: 15.3 ug/dL (ref 6.7–22.6)

## 2023-07-26 LAB — SALICYLATE LEVEL
Salicylate Lvl: 16.2 mg/dL (ref 7.0–30.0)
Salicylate Lvl: <7 mg/dL — ABNORMAL LOW (ref 7.0–30.0)
Salicylate Lvl: <7 mg/dL — ABNORMAL LOW (ref 7.0–30.0)

## 2023-07-26 LAB — ACETAMINOPHEN LEVEL: Acetaminophen (Tylenol), Serum: <10 ug/mL — ABNORMAL LOW (ref 10–30)

## 2023-07-26 MED ORDER — POTASSIUM CHLORIDE 10 MEQ/50ML IV SOLN
10.0000 meq | INTRAVENOUS | Status: AC
  Administered 2023-07-26 (×6): 10 meq via INTRAVENOUS
  Filled 2023-07-26 (×6): qty 50

## 2023-07-26 MED ORDER — POTASSIUM PHOSPHATES 15 MMOLE/5ML IV SOLN
15.0000 mmol | Freq: Once | INTRAVENOUS | Status: AC
  Administered 2023-07-26: 15 mmol via INTRAVENOUS
  Filled 2023-07-26: qty 5

## 2023-07-26 MED ORDER — DEXTROSE 5 % IV SOLN
30.0000 mmol | Freq: Once | INTRAVENOUS | Status: AC
  Administered 2023-07-26: 30 mmol via INTRAVENOUS
  Filled 2023-07-26: qty 10

## 2023-07-26 MED ORDER — MAGNESIUM SULFATE 50 % IJ SOLN
6.0000 g | Freq: Once | INTRAVENOUS | Status: AC
  Administered 2023-07-26: 6 g via INTRAVENOUS
  Filled 2023-07-26: qty 12

## 2023-07-26 MED ORDER — CHLORHEXIDINE GLUCONATE CLOTH 2 % EX PADS: 6.0000 | MEDICATED_PAD | Freq: Every day | CUTANEOUS | Status: DC

## 2023-07-26 MED ORDER — POTASSIUM CHLORIDE 10 MEQ/50ML IV SOLN
10.0000 meq | INTRAVENOUS | Status: AC
  Administered 2023-07-26 (×2): 10 meq via INTRAVENOUS
  Filled 2023-07-26 (×3): qty 50

## 2023-07-26 MED ORDER — CALCIUM GLUCONATE-NACL 2-0.675 GM/100ML-% IV SOLN
2.0000 g | Freq: Once | INTRAVENOUS | Status: AC
  Administered 2023-07-26: 2000 mg via INTRAVENOUS
  Filled 2023-07-26: qty 100

## 2023-07-26 MED ORDER — ENOXAPARIN SODIUM 40 MG/0.4ML IJ SOSY
40.0000 mg | PREFILLED_SYRINGE | INTRAMUSCULAR | Status: DC
  Administered 2023-07-26 – 2023-07-27 (×2): 40 mg via SUBCUTANEOUS
  Filled 2023-07-26 (×2): qty 0.4

## 2023-07-26 NOTE — Progress Notes (Signed)
 PCCM progress note  Afternoon rounds  Patient's mentation continues to remain borderline but she will now respond appropriately to pain and is able to elicit cough when stimulated.  No acute indications for intubation currently but she remains high risk for compromised ability to protect airway.  Decision made in collaboration with nephrology to complete 1 more intermittent HD session to hopefully improve mentation.  Hutch Rhett D. Harris, NP-C [facility] Personal contact information can be found on Amion  If no contact or response made please call [phone] 07/26/2023, 3:17 PM

## 2023-07-26 NOTE — Progress Notes (Signed)
 NAMEArianna Deleon, MRN:  [id number], DOB:  Oct 23, 1980, LOS: 1 ADMISSION DATE:  07/25/2023, CONSULTATION DATE:  3/18 REFERRING MD:  Sol Passer CHIEF COMPLAINT: Altered mental status  History of Present Illness:  Linda Deleon is a 43 year old female with a history of hypertension, ADHD, who presented to the ED with altered mental status.  She lives with her mother who is at bedside.  History is provided by mother as patient is encephalopathic.  Episode started about 4 days ago, was not acting like himself, with difficulty hearing, and acute confusion.  No new medicines.  Had some mild nausea, no vomiting.  She has had a dry cough, no URI symptoms, no episodes of aspiration. Symptoms progressively worsened on the weekend, prompting them to go to the [facility] on Monday.  Left due to long wait times.  She has had mild appetite denies diarrhea, seizures or urinary symptoms.  No psychiatric disease.Notably, she has a history of back pain and headaches and takes Goody powder for it.  She has been picking increased amount due to ongoing back pain and headaches.  The ED, WBCs 13, VBG 7.38/23.6/14.1, elevated AG Ethanol and acetaminophen normal.  Salicylate 92. UDS + benzo(given in the ED.)  CT of chest with right upper lobe consolidation. BHB 0.89, UA with mild ketones, negative nitrites.  Pertinent  Medical History  Hypertension, ADHD.  Significant Hospital Events: Including procedures, antibiotic start and stop dates in addition to other pertinent events   3/18: Admitted to the ICU 3/19 Mentation remains poor this am with increased oxygen demand, now on a NRB. CXR this am pending   Interim History / Subjective:  Responds to pain only   Objective   Blood pressure 109/61, pulse 92, temperature 100.2 F (37.9 C), resp. rate 12, height 5\' 4"  (1.626 m), weight 61.3 kg, last menstrual period 07/22/2023, SpO2 95%.    FiO2 (%):  [100 %] 100 %   Intake/Output Summary (Last 24 hours)  at 07/26/2023 0747 Last data filed at 07/26/2023 0700 Gross per 24 hour  Intake 6829.54 ml  Output 2000 ml  Net 4829.54 ml   Filed Weights   07/25/23 0816 07/25/23 1513 07/26/23 0500  Weight: 54.8 kg 54.8 kg 61.3 kg    Examination: General: Acute on chronically ill appearing adult female lying in bed   in NAD  HEENT: Morse/AT, MM pink/moist, PERRL,  Neuro: Withdrawals to pain only,  CV: s1s2 regular rate and rhythm, no murmur, rubs, or gallops,  PULM: Bilateral rhonchi right greater than left, tachypnea with shallow respirations, on nonrebreather GI: soft, bowel sounds active in all 4 quadrants, non-tender, non-distended Extremities: warm/dry, no edema  Skin: no rashes or lesions  Resolved Hospital Problem list   Anion gap metabolic acidosis  Assessment & Plan:  Severe salicylate toxicity  -Salicylate level 92.5 on admission, corrected to 16.2 post HD. -Unintentional overdose, 2/2 to Goody powder for back pain.  P: Nephrology following, appreciate assistance S/p HD 3/18 Optimize electrolytes Stop sodium bicarb drip Trend salicylate levels  Acute encephalopathy -Presenting with acute encephalopathy, tinnitus and nausea felt secondary to severe salicylate toxicity however a.m. 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  P: Continue ceftriaxone and azithromycin Continue nonrebreather Aspiration precautions Obtain sputum culture Continue close observation in the ICU for concern of ability to protect airway  Fever  -T max 101.5 P: Continue Azithromycin and Ceftriaxone  Trend CBC and fever  Likely LP ad above   Hypertension -Home regimen includes metoprolol, and lisinopril P: Home medication on hold   Best Practice (right click and "Reselect all SmartList Selections" daily)   Diet/type: NPO DVT prophylaxis: DOAC GI prophylaxis: H2B Lines: Central line Foley:  Yes, and it is still needed Code Status:  full code Last date of multidisciplinary goals of care discussion [none]  Critical care:  CRITICAL CARE Performed by: Tarena Gockley D. Harris   Total critical care time: 42 minutes  Critical care time was exclusive of separately billable procedures and treating other patients.  Critical care was necessary to treat or prevent imminent or life-threatening deterioration.  Critical care was time spent personally by me on the following activities: development of treatment plan with patient and/or surrogate as well as nursing, discussions with consultants, evaluation of patient's response to treatment, examination of patient, obtaining history from patient or surrogate, ordering and performing treatments and interventions, ordering and review of laboratory studies, ordering and review of radiographic studies, pulse oximetry and re-evaluation of patient's condition.  Dowell Hoon D. Harris, NP-C [facility] Personal contact information can be found on Amion  If no contact or response made please call [phone] 07/26/2023, 8:50 AM

## 2023-07-26 NOTE — Progress Notes (Signed)
EEG complete - results pending.  STAT

## 2023-07-26 NOTE — TOC CM/SW Note (Addendum)
 Transition of Care [facility]) - Inpatient Brief Assessment   Patient Details  Name: Linda Deleon MRN: [id number] Date of Birth: 04/11/1981  [facility] of [facility] CM/SW Contact:    Tom-Johnson, Hershal Coria, RN Phone Number: 07/26/2023, 3:11 PM   Clinical Narrative:  Patient presented to the ED with Altered Mental Status.  Labs in the ED showed WBC's 13,  elevated AG, Salicylate 92. UDS + Benzo(given in the ED.) CT Chest showed Rt Upper Lobe consolidation, UA with mild Ketones. Currently on Salter 45L, IV abx, Bicarb gtt.  HD line HD cath place for Emergent HD which was started for Salicylate Toxicity. Nephrology following. EEG to r/o Seizures.   CM went to assess patient in her room, unable to assess as patient only responds to pain at this time.  Patient not Medically ready for discharge.  CM will continue to follow as patient progresses with care towards discharge       Transition of Care Asessment:

## 2023-07-26 NOTE — Procedures (Addendum)
 Patient Name: Linda Deleon  MRN: [id number]  Epilepsy Attending: Charlsie Quest  Referring Physician/Provider: Talitha Givens, NP  Date: 07/26/2023 Duration: 23.30 mins  Patient history: 43yo F with ams. EEG to evaluate for seizure  Level of alertness: Awake/ lethargic   AEDs during EEG study: None  Technical aspects: This EEG study was done with scalp electrodes positioned according to the 10-20 International system of electrode placement. Electrical activity was reviewed with band pass filter of 1-70Hz , sensitivity of 7 uV/mm, display speed of 92mm/sec with a 60Hz  notched filter applied as appropriate. EEG data were recorded continuously and digitally stored.  Video monitoring was available and reviewed as appropriate.  Description: No posterior dominant rhythm was seen. EEG showed continuous generalized high amplitude 3 to 6 Hz theta-delta slowing with triphasic morphology. Hyperventilation and photic stimulation were not performed.     ABNORMALITY - Continuous slow, generalized  IMPRESSION: This study is suggestive of moderate diffuse encephalopathy likely related to toxic-metabolic causes.  No seizures or epileptiform discharges were seen throughout the recording.  Linda Deleon

## 2023-07-26 NOTE — Progress Notes (Signed)
 Linda Deleon is an 43 y.o. female  w/ h/o HTN, Kidney Stones, ADHD presenting with AMS with salicylate level of 92.5, with respiratory alkalosis and metabolic acidosis.  Assessment/Plan: Salicylate toxicity  Mixed metabolic acidosis-respiratory alkalosis - Salicylate level severely elevated to 92 with AMS, hypotension tolerated HD for 3 hrs evening of 3/18. - Salicylate level 16.2 2 hrs after HD. Will check another level; holding HD #2 unless there is rebound. CAP - per primary team on abx Metabolic encephalopathy secondary to salicylate toxicity HTN   Subjective: Confused and somnolent but withdraws to painful stimuli.  Brother bedside updated   Chemistry and CBC: Creatinine, Ser  Date/Time Value Ref Range Status  07/26/2023 01:52 AM 0.43 (L) 0.44 - 1.00 mg/dL Final  04/54/0981 19:14 AM 0.92 0.44 - 1.00 mg/dL Final  78/29/5621 30:86 PM 0.66 0.44 - 1.00 mg/dL Final   Recent Labs  Lab 07/25/23 0820 07/25/23 0853 07/25/23 1238 07/26/23 0152 07/26/23 0337  NA 140 141 143 133* 135  K 4.0 4.1 3.9 2.4* 2.6*  CL 109  --   --  97*  --   CO2 14*  --   --  31  --   GLUCOSE 109*  --   --  179*  --   BUN 20  --   --  <5*  --   CREATININE 0.92  --   --  0.43*  --   CALCIUM 8.4*  --   --  6.9*  --   PHOS  --   --   --  <1.0*  --    Recent Labs  Lab 07/25/23 0820 07/25/23 0853 07/25/23 1238 07/25/23 1754 07/26/23 0152 07/26/23 0337  WBC 11.7*  --   --  11.9* 13.9*  --   NEUTROABS 10.2*  --   --   --   --   --   HGB 14.8   < > 11.9* 10.9* 11.0* 11.2*  HCT 44.2   < > 35.0* 31.8* 32.0* 33.0*  MCV 91.5  --   --  91.6 90.4  --   PLT 233  --   --  174 162  --    < > = values in this interval not displayed.   Liver Function Tests: Recent Labs  Lab 07/25/23 0820  AST 32  ALT 22  ALKPHOS 60  BILITOT 0.7  PROT 7.8  ALBUMIN 4.0   Recent Labs  Lab 07/25/23 0820  LIPASE 21   Recent Labs  Lab 07/25/23 0821  AMMONIA 17   Cardiac Enzymes: Recent Labs  Lab  07/25/23 0820  CKTOTAL 158   Iron Studies: No results for input(s): "IRON", "TIBC", "TRANSFERRIN", "FERRITIN" in the last 72 hours. PT/INR: @LABRCNTIP (inr:5)  Xrays/Other Studies: ) Results for orders placed or performed during the hospital encounter of 07/25/23 (from the past 48 hours)  CBC with Differential     Status: Abnormal   Collection Time: 07/25/23  8:20 AM  Result Value Ref Range   WBC 11.7 (H) 4.0 - 10.5 K/uL   RBC 4.83 3.87 - 5.11 MIL/uL   Hemoglobin 14.8 12.0 - 15.0 g/dL   HCT 57.8 46.9 - 62.9 %   MCV 91.5 80.0 - 100.0 fL   MCH 30.6 26.0 - 34.0 pg   MCHC 33.5 30.0 - 36.0 g/dL   RDW 52.8 41.3 - 24.4 %   Platelets 233 150 - 400 K/uL   nRBC 0.3 (H) 0.0 - 0.2 %   Neutrophils Relative % 86 %  Neutro Abs 10.2 (H) 1.7 - 7.7 K/uL   Lymphocytes Relative 6 %   Lymphs Abs 0.7 0.7 - 4.0 K/uL   Monocytes Relative 7 %   Monocytes Absolute 0.8 0.1 - 1.0 K/uL   Eosinophils Relative 0 %   Eosinophils Absolute 0.0 0.0 - 0.5 K/uL   Basophils Relative 1 %   Basophils Absolute 0.1 0.0 - 0.1 K/uL   Immature Granulocytes 0 %   Abs Immature Granulocytes 0.05 0.00 - 0.07 K/uL    Comment: Performed at [facility], [address] [place], [place] [address]  Comprehensive metabolic panel     Status: Abnormal   Collection Time: 07/25/23  8:20 AM  Result Value Ref Range   Sodium 140 135 - 145 mmol/L   Potassium 4.0 3.5 - 5.1 mmol/L   Chloride 109 98 - 111 mmol/L   CO2 14 (L) 22 - 32 mmol/L   Glucose, Bld 109 (H) 70 - 99 mg/dL    Comment: Glucose reference range applies only to samples taken after fasting for at least 8 hours.   BUN 20 6 - 20 mg/dL   Creatinine, Ser 6.04 0.44 - 1.00 mg/dL   Calcium 8.4 (L) 8.9 - 10.3 mg/dL   Total Protein 7.8 6.5 - 8.1 g/dL   Albumin 4.0 3.5 - 5.0 g/dL   AST 32 15 - 41 U/L   ALT 22 0 - 44 U/L   Alkaline Phosphatase 60 38 - 126 U/L   Total Bilirubin 0.7 0.0 - 1.2 mg/dL   GFR, Estimated >54 >09 mL/min    Comment: (NOTE) Calculated  using the CKD-EPI Creatinine Equation (2021)    Anion gap 17 (H) 5 - 15    Comment: Performed at [facility], [address] [place], [place] [address]  Lipase, blood     Status: None   Collection Time: 07/25/23  8:20 AM  Result Value Ref Range   Lipase 21 11 - 51 U/L    Comment: Performed at [facility], [address] [place], [place] [address]  Ethanol     Status: None   Collection Time: 07/25/23  8:20 AM  Result Value Ref Range   Alcohol, Ethyl (B) <10 <10 mg/dL    Comment: (NOTE) Lowest detectable limit for serum alcohol is 10 mg/dL.  For medical purposes only. Performed at [facility], [address] [place], [place] [address]   Troponin I (High Sensitivity)     Status: None   Collection Time: 07/25/23  8:20 AM  Result Value Ref Range   Troponin I (High Sensitivity) 5 <18 ng/L    Comment: (NOTE) Elevated high sensitivity troponin I (hsTnI) values and significant  changes across serial measurements may suggest ACS but many other  chronic and acute conditions are known to elevate hsTnI results.  Refer to the "Links" section for chest pain algorithms and additional  guidance. Performed at [facility], [address] [place], [place] [address]   Brain natriuretic peptide     Status: Abnormal   Collection Time: 07/25/23  8:20 AM  Result Value Ref Range   B Natriuretic Peptide 120.2 (H) 0.0 - 100.0 pg/mL    Comment: Performed at [facility], [address] [place], [place] [address]  CK     Status: None   Collection Time: 07/25/23  8:20 AM  Result Value Ref Range   Total CK 158 38 -  234 U/L    Comment: Performed at [facility], [address] [place], [place] [address]  hCG, quantitative, pregnancy     Status: None   Collection Time: 07/25/23  8:21 AM  Result Value Ref Range   hCG, Beta Chain, Quant, S <1 <5 mIU/mL    Comment:          GEST. AGE      CONC.  (mIU/mL)   <=1 WEEK        5 -  50     2 WEEKS       50 - 500     3 WEEKS       100 - 10,000     4 WEEKS     1,000 - 30,000     5 WEEKS     3,500 - 115,000   6-8 WEEKS     12,000 - 270,000    12 WEEKS     15,000 - 220,000        FEMALE AND NON-PREGNANT FEMALE:     LESS THAN 5 mIU/mL Performed at [facility], [address] [place], [place] [address]   Resp panel by RT-PCR (RSV, Flu A&B, Covid) Anterior Nasal Swab     Status: None   Collection Time: 07/25/23  8:21 AM   Specimen: Anterior Nasal Swab  Result Value Ref Range   SARS Coronavirus 2 by RT PCR NEGATIVE NEGATIVE    Comment: (NOTE) SARS-CoV-2 target nucleic acids are NOT DETECTED.  The SARS-CoV-2 RNA is generally detectable in upper respiratory specimens during the acute phase of infection. The lowest concentration of SARS-CoV-2 viral copies this assay can detect is 138 copies/mL. A negative result does not preclude SARS-Cov-2 infection and should not be used as the sole basis for treatment or other patient management decisions. A negative result may occur with  improper specimen collection/handling, submission of specimen other than nasopharyngeal swab, presence of viral mutation(s) within the areas targeted by this assay, and inadequate number of viral copies(<138 copies/mL). A negative result must be combined with clinical observations, patient history, and epidemiological information. The expected result is Negative.  Fact Sheet for Patients:  BloggerCourse.com  Fact Sheet for Healthcare Providers:  SeriousBroker.it  This test is no t yet approved or cleared by the [place] FDA and  has been authorized for detection and/or diagnosis of SARS-CoV-2 by FDA under an Emergency Use Authorization (EUA). This EUA will remain  in effect (meaning this test can be used) for the duration of the COVID-19 declaration under Section 564(b)(1) of the Act, 21 U.S.C.section 360bbb-3(b)(1), unless  the authorization is terminated  or revoked sooner.       Influenza A by PCR NEGATIVE NEGATIVE   Influenza B by PCR NEGATIVE NEGATIVE    Comment: (NOTE) The Xpert Xpress SARS-CoV-2/FLU/RSV plus assay is intended as an aid in the diagnosis of influenza from Nasopharyngeal swab specimens and should not be used as a sole basis for treatment. Nasal washings and aspirates are unacceptable for Xpert Xpress SARS-CoV-2/FLU/RSV testing.  Fact Sheet for Patients: BloggerCourse.com  Fact Sheet for Healthcare Providers: SeriousBroker.it  This test is not yet approved or cleared by the [place] FDA and has been authorized for detection and/or diagnosis of SARS-CoV-2 by FDA under an Emergency Use Authorization (EUA). This EUA will remain in effect (meaning this test can be used) for the duration of the COVID-19 declaration under Section 564(b)(1) of the Act, 21 U.S.C. section  360bbb-3(b)(1), unless the authorization is terminated or revoked.     Resp Syncytial Virus by PCR NEGATIVE NEGATIVE    Comment: (NOTE) Fact Sheet for Patients: BloggerCourse.com  Fact Sheet for Healthcare Providers: SeriousBroker.it  This test is not yet approved or cleared by the [place] FDA and has been authorized for detection and/or diagnosis of SARS-CoV-2 by FDA under an Emergency Use Authorization (EUA). This EUA will remain in effect (meaning this test can be used) for the duration of the COVID-19 declaration under Section 564(b)(1) of the Act, 21 U.S.C. section 360bbb-3(b)(1), unless the authorization is terminated or revoked.  Performed at [facility], [address] [place], [place] [address]   TSH     Status: Abnormal   Collection Time: 07/25/23  8:21 AM  Result Value Ref Range   TSH 0.194 (L) 0.350 - 4.500 uIU/mL    Comment: Performed by a 3rd Generation assay with a  functional sensitivity of <=0.01 uIU/mL. Performed at [facility], [address] [place], [place] [address]   T4, free     Status: Abnormal   Collection Time: 07/25/23  8:21 AM  Result Value Ref Range   Free T4 0.57 (L) 0.61 - 1.12 ng/dL    Comment: (NOTE) Biotin ingestion may interfere with free T4 tests. If the results are inconsistent with the TSH level, previous test results, or the clinical presentation, then consider biotin interference. If needed, order repeat testing after stopping biotin. Performed at [facility], [address], [place], [place] [address]   Ammonia     Status: None   Collection Time: 07/25/23  8:21 AM  Result Value Ref Range   Ammonia 17 9 - 35 umol/L    Comment: Performed at [facility], [address] [place], [place] [address]  I-Stat venous blood gas, ED     Status: Abnormal   Collection Time: 07/25/23  8:53 AM  Result Value Ref Range   pH, Ven 7.381 7.25 - 7.43   pCO2, Ven 23.6 (L) 44 - 60 mmHg   pO2, Ven 17 (LL) 32 - 45 mmHg   Bicarbonate 14.1 (L) 20.0 - 28.0 mmol/L   TCO2 15 (L) 22 - 32 mmol/L   O2 Saturation 27 %   Acid-base deficit 9.0 (H) 0.0 - 2.0 mmol/L   Sodium 141 135 - 145 mmol/L   Potassium 4.1 3.5 - 5.1 mmol/L   Calcium, Ion 0.82 (LL) 1.15 - 1.40 mmol/L   HCT 46.0 36.0 - 46.0 %   Hemoglobin 15.6 (H) 12.0 - 15.0 g/dL   Patient temperature 95.2 F    Collection site IV start    Drawn by Nurse    Sample type VENOUS    Comment NOTIFIED PHYSICIAN   Lactic acid, plasma     Status: None   Collection Time: 07/25/23  8:59 AM  Result Value Ref Range   Lactic Acid, Venous 0.8 0.5 - 1.9 mmol/L    Comment: Performed at [facility], [address] [place], [place] [address]  POC CBG, ED     Status: Abnormal   Collection Time: 07/25/23  9:10 AM  Result Value Ref Range   Glucose-Capillary 113 (H) 70 - 99 mg/dL    Comment: Glucose reference range applies only to samples taken after fasting for at  least 8 hours.  Blood culture (routine x 2)     Status: None (Preliminary result)   Collection Time: 07/25/23  9:58 AM   Specimen: BLOOD LEFT HAND  Result Value Ref Range   Specimen Description      BLOOD LEFT HAND Performed at [facility], [address] [place], [place] [address]    Special Requests      BOTTLES DRAWN AEROBIC ONLY Blood Culture results may not be optimal due to an inadequate volume of blood received in culture bottles Performed at [facility], [address] [place], [place] [address]    Culture      NO GROWTH < 12 HOURS Performed at [facility], [address] [place], [place] [address]    Report Status PENDING   Rapid urine drug screen (hospital performed)     Status: Abnormal   Collection Time: 07/25/23 10:00 AM  Result Value Ref Range   Opiates NONE DETECTED NONE DETECTED   Cocaine NONE DETECTED NONE DETECTED   Benzodiazepines POSITIVE (A) NONE DETECTED   Amphetamines NONE DETECTED NONE DETECTED   Tetrahydrocannabinol NONE DETECTED NONE DETECTED   Barbiturates NONE DETECTED NONE DETECTED    Comment: (NOTE) DRUG SCREEN FOR MEDICAL PURPOSES ONLY.  IF CONFIRMATION IS NEEDED FOR ANY PURPOSE, NOTIFY LAB WITHIN 5 DAYS.  LOWEST DETECTABLE LIMITS FOR URINE DRUG SCREEN Drug Class                     Cutoff (ng/mL) Amphetamine and metabolites    1000 Barbiturate and metabolites    200 Benzodiazepine                 200 Opiates and metabolites        300 Cocaine and metabolites        300 THC                            50 Performed at [facility], [address] [place], [place] [address]   Urinalysis, Routine w reflex microscopic -Urine, Clean Catch     Status: Abnormal   Collection Time: 07/25/23 10:00 AM  Result Value Ref Range   Color, Urine YELLOW YELLOW   APPearance CLOUDY (A) CLEAR   Specific Gravity, Urine >=1.030 1.005 - 1.030   pH 5.0 5.0 - 8.0   Glucose, UA NEGATIVE NEGATIVE mg/dL   Hgb urine  dipstick LARGE (A) NEGATIVE   Bilirubin Urine NEGATIVE NEGATIVE   Ketones, ur 80 (A) NEGATIVE mg/dL   Protein, ur 846 (A) NEGATIVE mg/dL   Nitrite NEGATIVE NEGATIVE   Leukocytes,Ua SMALL (A) NEGATIVE    Comment: Performed at [facility], [address] [place], [place] [address]  Urinalysis, Microscopic (reflex)     Status: Abnormal   Collection Time: 07/25/23 10:00 AM  Result Value Ref Range   RBC / HPF 6-10 0 - 5 RBC/hpf   WBC, UA 11-20 0 - 5 WBC/hpf   Bacteria, UA FEW (A) NONE SEEN   Squamous Epithelial / HPF 0-5 0 - 5 /HPF   WBC Clumps PRESENT    Hyaline Casts, UA PRESENT    Amorphous Shannyn PRESENT     Comment: Performed at [facility], [address] [place], [place] [address]  Blood culture (routine x 2)     Status: None (Preliminary result)   Collection Time: 07/25/23 10:02 AM   Specimen: BLOOD RIGHT FOREARM  Result Value Ref Range   Specimen Description  BLOOD RIGHT FOREARM Performed at [facility], [address] [place], [place] [address]    Special Requests      BOTTLES DRAWN AEROBIC AND ANAEROBIC Blood Culture adequate volume Performed at [facility], [address] [place], [place] [address]    Culture      NO GROWTH < 12 HOURS Performed at [facility], [address] [place], [place] [address]    Report Status PENDING   Salicylate level     Status: Abnormal   Collection Time: 07/25/23 12:17 PM  Result Value Ref Range   Salicylate Lvl 92.5 (HH) 7.0 - 30.0 mg/dL    Comment: CRITICAL RESULT CALLED TO, READ BACK BY AND VERIFIED WITH ALI NOAH RN AT 1342 ON 07/25/23 BY [email or web address] Performed at [facility], [address] [place], [place] [address]   Acetaminophen level     Status: Abnormal   Collection Time: 07/25/23 12:17 PM  Result Value Ref Range   Acetaminophen (Tylenol), Serum <10 (L) 10 - 30 ug/mL    Comment: (NOTE) Therapeutic concentrations vary significantly. A range of 10-30  ug/mL  may be an effective concentration for many patients. However, some  are best treated at concentrations outside of this range. Acetaminophen concentrations >150 ug/mL at 4 hours after ingestion  and >50 ug/mL at 12 hours after ingestion are often associated with  toxic reactions.  Performed at [facility], [address] [place], [place] [address]   Osmolality     Status: Abnormal   Collection Time: 07/25/23 12:17 PM  Result Value Ref Range   Osmolality 297 (H) 275 - 295 mOsm/kg    Comment: REPEATED TO VERIFY Performed at [facility], [address], [place], [place] [address]   Beta-hydroxybutyric acid     Status: Abnormal   Collection Time: 07/25/23 12:17 PM  Result Value Ref Range   Beta-Hydroxybutyric Acid 0.89 (H) 0.05 - 0.27 mmol/L    Comment: Performed at [facility], [address], [place], [place] [address]  I-Stat venous blood gas, ED     Status: Abnormal   Collection Time: 07/25/23 12:38 PM  Result Value Ref Range   pH, Ven 7.346 7.25 - 7.43   pCO2, Ven 17.7 (LL) 44 - 60 mmHg   pO2, Ven 59 (H) 32 - 45 mmHg   Bicarbonate 9.8 (L) 20.0 - 28.0 mmol/L   TCO2 10 (L) 22 - 32 mmol/L   O2 Saturation 91 %   Acid-base deficit 14.0 (H) 0.0 - 2.0 mmol/L   Sodium 143 135 - 145 mmol/L   Potassium 3.9 3.5 - 5.1 mmol/L   Calcium, Ion 0.76 (LL) 1.15 - 1.40 mmol/L   HCT 35.0 (L) 36.0 - 46.0 %   Hemoglobin 11.9 (L) 12.0 - 15.0 g/dL   Patient temperature 52.7 F    Collection site IV start    Drawn by Nurse    Sample type VENOUS    Comment NOTIFIED PHYSICIAN   Glucose, capillary     Status: Abnormal   Collection Time: 07/25/23  4:27 PM  Result Value Ref Range   Glucose-Capillary 254 (H) 70 - 99 mg/dL    Comment: Glucose reference range applies only to samples taken after fasting for at least 8 hours.  HIV Antibody (routine testing w rflx)     Status: None   Collection Time: 07/25/23  5:54 PM  Result Value Ref Range  HIV Screen 4th Generation  wRfx Non Reactive Non Reactive    Comment: Performed at [facility], [address] [place], [place] [address]  CBC     Status: Abnormal   Collection Time: 07/25/23  5:54 PM  Result Value Ref Range   WBC 11.9 (H) 4.0 - 10.5 K/uL   RBC 3.47 (L) 3.87 - 5.11 MIL/uL   Hemoglobin 10.9 (L) 12.0 - 15.0 g/dL   HCT 60.4 (L) 54.0 - 98.1 %   MCV 91.6 80.0 - 100.0 fL   MCH 31.4 26.0 - 34.0 pg   MCHC 34.3 30.0 - 36.0 g/dL   RDW 19.1 47.8 - 29.5 %   Platelets 174 150 - 400 K/uL   nRBC 0.0 0.0 - 0.2 %    Comment: Performed at [facility], [address], [place], [place] [address]  Hepatitis B surface antibody,quantitative     Status: Abnormal   Collection Time: 07/25/23  5:54 PM  Result Value Ref Range   Hep B S AB Quant (Post) <3.5 (L) Immunity>10 mIU/mL    Comment: (NOTE)  Status of Immunity                     Anti-HBs Level  ------------------                     -------------- Inconsistent with Immunity                  0.0 - 10.0 Consistent with Immunity                         >10.0 Performed At: [facility] [address] [place], [place] [id number] Jolene Schimke MD EX:[id number]   Hepatitis B surface antigen     Status: None   Collection Time: 07/25/23  5:54 PM  Result Value Ref Range   Hepatitis B Surface Ag NON REACTIVE NON REACTIVE    Comment: Performed at [facility], [address] [place], [place] [address]  Glucose, capillary     Status: Abnormal   Collection Time: 07/25/23  6:01 PM  Result Value Ref Range   Glucose-Capillary 285 (H) 70 - 99 mg/dL    Comment: Glucose reference range applies only to samples taken after fasting for at least 8 hours.  MRSA Next Gen by PCR, Nasal     Status: None   Collection Time: 07/25/23  6:08 PM   Specimen: Nasal Mucosa; Nasal Swab  Result Value Ref Range   MRSA by PCR Next Gen NOT DETECTED NOT DETECTED    Comment: (NOTE) The GeneXpert MRSA Assay (FDA approved for NASAL specimens only), is one component  of a comprehensive MRSA colonization surveillance program. It is not intended to diagnose MRSA infection nor to guide or monitor treatment for MRSA infections. Test performance is not FDA approved in patients less than 66 years old. Performed at [facility], [address], [place], [place] [address]   Glucose, capillary     Status: Abnormal   Collection Time: 07/25/23  7:44 PM  Result Value Ref Range   Glucose-Capillary 277 (H) 70 - 99 mg/dL    Comment: Glucose reference range applies only to samples taken after fasting for at least 8 hours.  Glucose, capillary     Status: Abnormal   Collection Time: 07/25/23 11:55 PM  Result Value Ref Range   Glucose-Capillary 122 (H) 70 - 99  mg/dL    Comment: Glucose reference range applies only to samples taken after fasting for at least 8 hours.  Basic metabolic panel     Status: Abnormal   Collection Time: 07/26/23  1:52 AM  Result Value Ref Range   Sodium 133 (L) 135 - 145 mmol/L    Comment: DELTA CHECK NOTED   Potassium 2.4 (LL) 3.5 - 5.1 mmol/L    Comment: REPEATED TO VERIFY CRITICAL RESULT CALLED TO, READ BACK BY AND VERIFIED WITH Wynona Luna RN @0244  07/26/23 SATRAINR    Chloride 97 (L) 98 - 111 mmol/L   CO2 31 22 - 32 mmol/L   Glucose, Bld 179 (H) 70 - 99 mg/dL    Comment: Glucose reference range applies only to samples taken after fasting for at least 8 hours.   BUN <5 (L) 6 - 20 mg/dL   Creatinine, Ser 0.86 (L) 0.44 - 1.00 mg/dL   Calcium 6.9 (L) 8.9 - 10.3 mg/dL   GFR, Estimated >57 >84 mL/min    Comment: (NOTE) Calculated using the CKD-EPI Creatinine Equation (2021)    Anion gap 5 5 - 15    Comment: Performed at [facility], [address] [place], [place] [address]  CBC     Status: Abnormal   Collection Time: 07/26/23  1:52 AM  Result Value Ref Range   WBC 13.9 (H) 4.0 - 10.5 K/uL   RBC 3.54 (L) 3.87 - 5.11 MIL/uL   Hemoglobin 11.0 (L) 12.0 - 15.0 g/dL   HCT 52.8 (L) 41.3 - 24.4 %   MCV 90.4 80.0 - 100.0  fL   MCH 31.1 26.0 - 34.0 pg   MCHC 34.4 30.0 - 36.0 g/dL   RDW 01.0 27.2 - 53.6 %   Platelets 162 150 - 400 K/uL   nRBC 0.1 0.0 - 0.2 %    Comment: Performed at [facility], [address], [place], [place] [address]  Magnesium     Status: Abnormal   Collection Time: 07/26/23  1:52 AM  Result Value Ref Range   Magnesium 1.6 (L) 1.7 - 2.4 mg/dL    Comment: Performed at [facility], [address] [place], [place] [address]  Phosphorus     Status: Abnormal   Collection Time: 07/26/23  1:52 AM  Result Value Ref Range   Phosphorus <1.0 (LL) 2.5 - 4.6 mg/dL    Comment: REPEATED TO VERIFY CRITICAL RESULT CALLED TO, READ BACK BY AND VERIFIED WITH Wynona Luna RN @0244  07/26/23 SATRAINR Performed at [facility], [address] [place], [place] [address]   Cortisol-am, blood     Status: None   Collection Time: 07/26/23  1:52 AM  Result Value Ref Range   Cortisol - AM 15.3 6.7 - 22.6 ug/dL    Comment: Performed at [facility], [address] [place], [place] [address]  Salicylate level     Status: None   Collection Time: 07/26/23  1:52 AM  Result Value Ref Range   Salicylate Lvl 16.2 7.0 - 30.0 mg/dL    Comment: Performed at [facility], [address] [place], [place] [address]  I-STAT 7, (LYTES, BLD GAS, ICA, H+H)     Status: Abnormal   Collection Time: 07/26/23  3:37 AM  Result Value Ref Range   pH, Arterial 7.411 7.35 - 7.45   pCO2 arterial 48.9 (H) 32 - 48 mmHg   pO2, Arterial 51 (L) 83 - 108 mmHg  Bicarbonate 30.6 (H) 20.0 - 28.0 mmol/L   TCO2 32 22 - 32 mmol/L   O2 Saturation 82 %   Acid-Base Excess 6.0 (H) 0.0 - 2.0 mmol/L   Sodium 135 135 - 145 mmol/L   Potassium 2.6 (LL) 3.5 - 5.1 mmol/L   Calcium, Ion 1.05 (L) 1.15 - 1.40 mmol/L   HCT 33.0 (L) 36.0 - 46.0 %   Hemoglobin 11.2 (L) 12.0 - 15.0 g/dL   Patient temperature 161.0 F    Collection site RADIAL, ALLEN'S TEST ACCEPTABLE    Drawn by RT    Sample type ARTERIAL    Comment  NOTIFIED PHYSICIAN   Glucose, capillary     Status: Abnormal   Collection Time: 07/26/23  3:53 AM  Result Value Ref Range   Glucose-Capillary 130 (H) 70 - 99 mg/dL    Comment: Glucose reference range applies only to samples taken after fasting for at least 8 hours.   DG CHEST PORT 1 VIEW Result Date: 07/25/2023 CLINICAL DATA:  [id number] Central venous catheter in place [id number] EXAM: PORTABLE CHEST - 1 VIEW COMPARISON:  Earlier film of the same day FINDINGS: Worsening patchy mid lung airspace opacities right greater than left. Progressive peripheral consolidation in the left upper lobe. Heart size and mediastinal contours are within normal limits. Right IJ central line placement to the distal SVC. No pneumothorax. No effusion. Visualized bones unremarkable. IMPRESSION: 1. Right IJ central line placement to the distal SVC. No pneumothorax. 2. Worsening bilateral airspace disease. Electronically Signed   By: Corlis Leak M.D.   On: 07/25/2023 19:11   CT Angio Chest PE W and/or Wo Contrast Result Date: 07/25/2023 CLINICAL DATA:  Pulmonary embolism (PE) suspected, high prob Cough with shortness of breath for 2 days.  Altered mental status. EXAM: CT ANGIOGRAPHY CHEST WITH CONTRAST TECHNIQUE: Multidetector CT imaging of the chest was performed using the standard protocol during bolus administration of intravenous contrast. Multiplanar CT image reconstructions and MIPs were obtained to evaluate the vascular anatomy. RADIATION DOSE REDUCTION: This exam was performed according to the departmental dose-optimization program which includes automated exposure control, adjustment of the mA and/or kV according to patient size and/or use of iterative reconstruction technique. CONTRAST:  75mL OMNIPAQUE IOHEXOL 350 MG/ML SOLN COMPARISON:  Radiographs 07/25/2023 and 07/02/2006. FINDINGS: Technical note: Despite efforts by the technologist and patient, mild motion artifact is present on today's exam and could not be eliminated.  This reduces exam sensitivity and specificity. Cardiovascular: The pulmonary arteries are adequately opacified with contrast to the level of the segmental branches. There is no evidence of acute pulmonary embolism. No acute systemic arterial abnormalities are identified. There is mild reflux of contrast into the hepatic veins. The heart size is normal. There is no pericardial effusion. Mediastinum/Nodes: There are no enlarged mediastinal, hilar or axillary lymph nodes. The thyroid gland, trachea and esophagus demonstrate no significant findings. Lungs/Pleura: No pleural effusion or pneumothorax. There are patchy ground-glass opacities anteriorly in both upper lobes which may be secondary to atypical infection or asymmetric edema. There is also mild involvement peripherally in the left lower lobe. No consolidation or suspicious pulmonary nodularity. Upper abdomen: No acute or significant findings are seen in the visualized upper abdomen. Probable 1.5 cm cyst in the upper pole of the left kidney for which no specific follow-up imaging is recommended. Musculoskeletal/Chest wall: There is no chest wall mass or suspicious osseous finding. Review of the MIP images confirms the above findings. IMPRESSION: 1. No evidence of acute pulmonary embolism or  other acute vascular findings in the chest. 2. Patchy ground-glass opacities anteriorly in both upper lobes and peripherally in the left lower lobe, possibly secondary to atypical infection or asymmetric edema. No consolidation or pleural effusion. Electronically Signed   By: Carey Bullocks M.D.   On: 07/25/2023 12:20   CT HEAD WO CONTRAST ( ) Result Date: 07/25/2023 CLINICAL DATA:  Neurologic deficit. EXAM: CT HEAD WITHOUT CONTRAST TECHNIQUE: Contiguous axial images were obtained from the base of the skull through the vertex without intravenous contrast. RADIATION DOSE REDUCTION: This exam was performed according to the departmental dose-optimization program which  includes automated exposure control, adjustment of the mA and/or kV according to patient size and/or use of iterative reconstruction technique. COMPARISON:  None Available. FINDINGS: Brain: The ventricles and sulci appropriate size for patient's age. Small area of low attenuation involving the medial left cerebellar hemisphere suspicious for a small old infarct and encephalomalacia. There is no acute intracranial hemorrhage. No mass effect or midline shift. No extra-axial fluid collection. Vascular: No hyperdense vessel or unexpected calcification. Skull: Normal. Negative for fracture or focal lesion. Sinuses/Orbits: No acute finding. Other: None IMPRESSION: 1. No acute intracranial pathology. 2. 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 Electronically Signed   By: Elgie Collard M.D.   On: 07/25/2023 12:16   DG Chest Portable 1 View Result Date: 07/25/2023 CLINICAL DATA:  Cough and shortness of breath for the past 2 days. EXAM: PORTABLE CHEST 1 VIEW COMPARISON:  Chest x-ray dated July 02, 2006. FINDINGS: The heart size and mediastinal contours are within normal limits. Normal pulmonary vascularity. Patchy opacities in the right mid lung, likely in the upper lobe. No pleural effusion or pneumothorax. No acute osseous abnormality. IMPRESSION: 1. Right upper lobe pneumonia. Electronically Signed   By: Obie Dredge M.D.   On: 07/25/2023 09:47    PMH:   Past Medical History:  Diagnosis Date   ADHD    Hypertension    Kidney stone     PSH:  History reviewed. No pertinent surgical history.  Allergies:  Allergies  Allergen Reactions   Sulfamethoxazole-Trimethoprim Hives and Itching    Medications:   Prior to Admission medications   Medication Sig Start Date End Date Taking? Authorizing Provider  cetirizine (ZYRTEC) 10 MG tablet Take 10 mg by mouth daily as needed for allergies.   Yes [provider]  magic mouthwash (nystatin,  lidocaine, diphenhydrAMINE, alum & mag hydroxide) suspension Swish and swallow 5 mLs 4 (four) times daily as needed for mouth pain. 12/06/22  Yes Valinda Hoar, NP  metoprolol tartrate (LOPRESSOR) 50 MG tablet Take 1 tablet by mouth twice daily 02/24/23  Yes Zonia Kief, Amy J, NP    Discontinued Meds:   Medications Discontinued During This Encounter  Medication Reason   ziprasidone (GEODON) injection 20 mg    LORazepam (ATIVAN) injection 1 mg    ziprasidone (GEODON) injection 20 mg    LORazepam (ATIVAN) injection 2 mg Completed Course   heparin injection 1,000 Units    heparin lock flush 100 unit/mL    cefTRIAXone (ROCEPHIN) 2 g in sodium chloride 0.9 % 100 mL IVPB     Social History:  reports that she has been smoking cigarettes. She started smoking about 2 years ago. She has a 1.1 pack-year smoking history. She has never used smokeless tobacco. She reports that she does not currently use alcohol. She reports that she does not currently use drugs.  Family History:  History reviewed. No pertinent  family history.  Blood pressure 109/61, pulse 92, temperature 100.2 F (37.9 C), resp. rate 12, height 5\' 4"  (1.626 m), weight 61.3 kg, last menstrual period 07/22/2023, SpO2 95%. Physical Exam: General: acutely ill appearing Neuro: Responds to painful stimuli, non-verbal Cardiovascular: RRR Respiratory: mildly tachypneic but much lower rate than in the ED Abdomen: soft, NTTP, no rebound or guarding Extremities: Withdraws to pain     Ethelene Hal, MD 07/26/2023, 7:37 AM

## 2023-07-26 NOTE — Progress Notes (Signed)
 Patient had 3 hours heparin free treatment.  UF keep even no fluids removed.   07/26/23 2016  Vitals  BP (!) 118/53  MAP (mmHg) 87  Resp 16  During Treatment Monitoring  HD Safety Checks Performed Yes  Intra-Hemodialysis Comments Tx completed  Post Treatment  Dialyzer Clearance Lightly streaked  Hemodialysis Intake (mL) 0 mL  Liters Processed 70  Fluid Removed (mL) 0 mL  Tolerated HD Treatment Yes  Post-Hemodialysis Comments Tx done.  Hemodialysis Catheter Right Internal jugular Triple lumen Temporary (Non-Tunneled)  Placement Date/Time: 07/25/23 1714   Placed prior to admission: No  Time Out: Correct patient;Correct site;Correct procedure  Maximum sterile barrier precautions: Hand hygiene;Cap;Mask;Sterile gown;Sterile gloves;Large sterile sheet;Sterile probe cove...  Site Condition No complications  Blue Lumen Status Heparin locked  Red Lumen Status Heparin locked  Catheter fill solution Heparin 1000 units/ml  Catheter fill volume (Arterial) 1.2 cc  Catheter fill volume (Venous) 1.2  Dressing Type Transparent  Dressing Status Antimicrobial disc/dressing in place  Interventions New dressing  Drainage Description None  Dressing Change Due 08/01/23  Post treatment catheter status Capped and Clamped

## 2023-07-27 DIAGNOSIS — R4182 Altered mental status, unspecified: Secondary | ICD-10-CM | POA: Diagnosis not present

## 2023-07-27 DIAGNOSIS — J9601 Acute respiratory failure with hypoxia: Secondary | ICD-10-CM

## 2023-07-27 DIAGNOSIS — E872 Acidosis, unspecified: Secondary | ICD-10-CM | POA: Diagnosis not present

## 2023-07-27 DIAGNOSIS — G9340 Encephalopathy, unspecified: Secondary | ICD-10-CM | POA: Diagnosis not present

## 2023-07-27 DIAGNOSIS — J189 Pneumonia, unspecified organism: Secondary | ICD-10-CM | POA: Diagnosis not present

## 2023-07-27 DIAGNOSIS — T39094A Poisoning by salicylates, undetermined, initial encounter: Secondary | ICD-10-CM | POA: Diagnosis not present

## 2023-07-27 DIAGNOSIS — I10 Essential (primary) hypertension: Secondary | ICD-10-CM | POA: Diagnosis not present

## 2023-07-27 DIAGNOSIS — J9602 Acute respiratory failure with hypercapnia: Secondary | ICD-10-CM | POA: Diagnosis not present

## 2023-07-27 LAB — CBC
HCT: 35.4 % — ABNORMAL LOW (ref 36.0–46.0)
Hemoglobin: 11.5 g/dL — ABNORMAL LOW (ref 12.0–15.0)
MCH: 30.5 pg (ref 26.0–34.0)
MCHC: 32.5 g/dL (ref 30.0–36.0)
MCV: 93.9 fL (ref 80.0–100.0)
Platelets: 131 10*3/uL — ABNORMAL LOW (ref 150–400)
RBC: 3.77 MIL/uL — ABNORMAL LOW (ref 3.87–5.11)
RDW: 14.5 % (ref 11.5–15.5)
WBC: 10.6 10*3/uL — ABNORMAL HIGH (ref 4.0–10.5)
nRBC: 0 % (ref 0.0–0.2)

## 2023-07-27 LAB — GLUCOSE, CAPILLARY
Glucose-Capillary: 99 mg/dL (ref 70–99)
Glucose-Capillary: 113 mg/dL — ABNORMAL HIGH (ref 70–99)
Glucose-Capillary: 111 mg/dL — ABNORMAL HIGH (ref 70–99)
Glucose-Capillary: 146 mg/dL — ABNORMAL HIGH (ref 70–99)
Glucose-Capillary: 110 mg/dL — ABNORMAL HIGH (ref 70–99)

## 2023-07-27 LAB — BASIC METABOLIC PANEL
Anion gap: 9 (ref 5–15)
BUN: <5 mg/dL — ABNORMAL LOW (ref 6–20)
CO2: 25 mmol/L (ref 22–32)
Calcium: 7.8 mg/dL — ABNORMAL LOW (ref 8.9–10.3)
Chloride: 101 mmol/L (ref 98–111)
Creatinine, Ser: 0.52 mg/dL (ref 0.44–1.00)
GFR, Estimated: >60 mL/min (ref >60)
Glucose, Bld: 108 mg/dL — ABNORMAL HIGH (ref 70–99)
Potassium: 3.8 mmol/L (ref 3.5–5.1)
Sodium: 135 mmol/L (ref 135–145)

## 2023-07-27 LAB — PHOSPHORUS: Phosphorus: 2.3 mg/dL — ABNORMAL LOW (ref 2.5–4.6)

## 2023-07-27 LAB — MAGNESIUM: Magnesium: 2.1 mg/dL (ref 1.7–2.4)

## 2023-07-27 MED ORDER — POTASSIUM PHOSPHATES 15 MMOLE/5ML IV SOLN
30.0000 mmol | Freq: Once | INTRAVENOUS | Status: AC
  Administered 2023-07-27: 30 mmol via INTRAVENOUS
  Filled 2023-07-27: qty 10

## 2023-07-27 MED ORDER — ACETAMINOPHEN 325 MG PO TABS
650.0000 mg | ORAL_TABLET | ORAL | Status: DC | PRN
  Administered 2023-07-27 – 2023-07-28 (×2): 650 mg via ORAL
  Filled 2023-07-27 (×2): qty 2

## 2023-07-27 MED ORDER — NYSTATIN 100000 UNIT/ML MT SUSP
5.0000 mL | Freq: Four times a day (QID) | OROMUCOSAL | Status: DC
  Administered 2023-07-27 – 2023-07-28 (×4): 500000 [IU] via OROMUCOSAL
  Filled 2023-07-27 (×7): qty 5

## 2023-07-27 NOTE — Progress Notes (Signed)
 Linda Deleon is an 43 y.o. female  w/ h/o HTN, Kidney Stones, ADHD presenting with AMS with salicylate level of 92.5, with respiratory alkalosis and metabolic acidosis.  Assessment/Plan: Salicylate toxicity  Mixed metabolic acidosis-respiratory alkalosis - Salicylate level severely elevated to 92 with AMS, hypotension tolerated HD for 3 hrs evening of 3/18. - Salicylate level 16.2 2 hrs after HD and repeat even lower.  Dialyzed again per CCM request given altered mental status, tolerated treatment.  Patient is much more awake now, conversing.  Will not need anymore additional treatments.    Signing off at this time; please reconsult as needed.  CAP - per primary team on abx Metabolic encephalopathy secondary to salicylate toxicity HTN   Subjective: More awake and alert today, conversing, answering questions appropriately.  Family at bedside updated.   Chemistry and CBC: Creatinine, Ser  Date/Time Value Ref Range Status  07/27/2023 04:37 AM 0.52 0.44 - 1.00 mg/dL Final  62/95/2841 32:44 PM 0.35 (L) 0.44 - 1.00 mg/dL Final  05/11/7251 66:44 PM 0.50 0.44 - 1.00 mg/dL Final  03/47/4259 56:38 AM 0.43 (L) 0.44 - 1.00 mg/dL Final  75/64/3329 51:88 AM 0.92 0.44 - 1.00 mg/dL Final  41/66/0630 16:01 PM 0.66 0.44 - 1.00 mg/dL Final   Recent Labs  Lab 07/25/23 0820 07/25/23 0853 07/25/23 1238 07/26/23 0152 07/26/23 0337 07/26/23 0923 07/26/23 1251 07/26/23 2126 07/27/23 0437  NA 140   < > 143 133* 135 132* 134* 132* 135  K 4.0   < > 3.9 2.4* 2.6* 3.5 3.3* 3.4* 3.8  CL 109  --   --  97*  --   --  100 97* 101  CO2 14*  --   --  31  --   --  28 28 25   GLUCOSE 109*  --   --  179*  --   --  117* 124* 108*  BUN 20  --   --  <5*  --   --  <5* <5* <5*  CREATININE 0.92  --   --  0.43*  --   --  0.50 0.35* 0.52  CALCIUM 8.4*  --   --  6.9*  --   --  7.0* 7.4* 7.8*  PHOS  --   --   --  <1.0*  --   --  2.2* 2.0* 2.3*   < > = values in this interval not displayed.   Recent Labs  Lab  07/25/23 0820 07/25/23 0853 07/25/23 1754 07/26/23 0152 07/26/23 0337 07/26/23 0923 07/27/23 0437  WBC 11.7*  --  11.9* 13.9*  --   --  10.6*  NEUTROABS 10.2*  --   --   --   --   --   --   HGB 14.8   < > 10.9* 11.0* 11.2* 11.2* 11.5*  HCT 44.2   < > 31.8* 32.0* 33.0* 33.0* 35.4*  MCV 91.5  --  91.6 90.4  --   --  93.9  PLT 233  --  174 162  --   --  131*   < > = values in this interval not displayed.   Liver Function Tests: Recent Labs  Lab 07/25/23 0820  AST 32  ALT 22  ALKPHOS 60  BILITOT 0.7  PROT 7.8  ALBUMIN 4.0   Recent Labs  Lab 07/25/23 0820  LIPASE 21   Recent Labs  Lab 07/25/23 0821  AMMONIA 17   Cardiac Enzymes: Recent Labs  Lab 07/25/23 0820  CKTOTAL 158   Iron  Studies: No results for input(s): "IRON", "TIBC", "TRANSFERRIN", "FERRITIN" in the last 72 hours. PT/INR: @LABRCNTIP (inr:5)  Xrays/Other Studies: ) Results for orders placed or performed during the hospital encounter of 07/25/23 (from the past 48 hours)  Salicylate level     Status: Abnormal   Collection Time: 07/25/23 12:17 PM  Result Value Ref Range   Salicylate Lvl 92.5 (HH) 7.0 - 30.0 mg/dL    Comment: CRITICAL RESULT CALLED TO, READ BACK BY AND VERIFIED WITH ALI NOAH RN AT 1342 ON 07/25/23 BY [email or web address] Performed at [facility], [address] [place], [place] [address]   Acetaminophen level     Status: Abnormal   Collection Time: 07/25/23 12:17 PM  Result Value Ref Range   Acetaminophen (Tylenol), Serum <10 (L) 10 - 30 ug/mL    Comment: (NOTE) Therapeutic concentrations vary significantly. A range of 10-30 ug/mL  may be an effective concentration for many patients. However, some  are best treated at concentrations outside of this range. Acetaminophen concentrations >150 ug/mL at 4 hours after ingestion  and >50 ug/mL at 12 hours after ingestion are often associated with  toxic reactions.  Performed at [facility], [address] [place], [place]  [address]   Osmolality     Status: Abnormal   Collection Time: 07/25/23 12:17 PM  Result Value Ref Range   Osmolality 297 (H) 275 - 295 mOsm/kg    Comment: REPEATED TO VERIFY Performed at [facility], [address] [place], [place] [address]   Beta-hydroxybutyric acid     Status: Abnormal   Collection Time: 07/25/23 12:17 PM  Result Value Ref Range   Beta-Hydroxybutyric Acid 0.89 (H) 0.05 - 0.27 mmol/L    Comment: Performed at [facility], [address], [place], [place] [address]  I-Stat venous blood gas, ED     Status: Abnormal   Collection Time: 07/25/23 12:38 PM  Result Value Ref Range   pH, Ven 7.346 7.25 - 7.43   pCO2, Ven 17.7 (LL) 44 - 60 mmHg   pO2, Ven 59 (H) 32 - 45 mmHg   Bicarbonate 9.8 (L) 20.0 - 28.0 mmol/L   TCO2 10 (L) 22 - 32 mmol/L   O2 Saturation 91 %   Acid-base deficit 14.0 (H) 0.0 - 2.0 mmol/L   Sodium 143 135 - 145 mmol/L   Potassium 3.9 3.5 - 5.1 mmol/L   Calcium, Ion 0.76 (LL) 1.15 - 1.40 mmol/L   HCT 35.0 (L) 36.0 - 46.0 %   Hemoglobin 11.9 (L) 12.0 - 15.0 g/dL   Patient temperature 29.5 F    Collection site IV start    Drawn by Nurse    Sample type VENOUS    Comment NOTIFIED PHYSICIAN   Glucose, capillary     Status: Abnormal   Collection Time: 07/25/23  4:27 PM  Result Value Ref Range   Glucose-Capillary 254 (H) 70 - 99 mg/dL    Comment: Glucose reference range applies only to samples taken after fasting for at least 8 hours.  HIV Antibody (routine testing w rflx)     Status: None   Collection Time: 07/25/23  5:54 PM  Result Value Ref Range   HIV Screen 4th Generation wRfx Non Reactive Non Reactive    Comment: Performed at [facility], [address] [place], [place] [address]  CBC     Status: Abnormal   Collection Time: 07/25/23  5:54 PM  Result Value Ref Range  WBC 11.9 (H) 4.0 - 10.5 K/uL   RBC 3.47 (L) 3.87 - 5.11 MIL/uL   Hemoglobin 10.9 (L) 12.0 - 15.0 g/dL   HCT 18.8 (L) 41.6 - 60.6 %   MCV 91.6 80.0 - 100.0  fL   MCH 31.4 26.0 - 34.0 pg   MCHC 34.3 30.0 - 36.0 g/dL   RDW 30.1 60.1 - 09.3 %   Platelets 174 150 - 400 K/uL   nRBC 0.0 0.0 - 0.2 %    Comment: Performed at [facility], [address], [place], [place] [address]  Hepatitis B surface antibody,quantitative     Status: Abnormal   Collection Time: 07/25/23  5:54 PM  Result Value Ref Range   Hep B S AB Quant (Post) <3.5 (L) Immunity>10 mIU/mL    Comment: (NOTE)  Status of Immunity                     Anti-HBs Level  ------------------                     -------------- Inconsistent with Immunity                  0.0 - 10.0 Consistent with Immunity                         >10.0 Performed At: [facility] [address] [place], [place] [id number] Jolene Schimke MD CW:[id number]   Hepatitis B surface antigen     Status: None   Collection Time: 07/25/23  5:54 PM  Result Value Ref Range   Hepatitis B Surface Ag NON REACTIVE NON REACTIVE    Comment: Performed at [facility], [address] [place], [place] [address]  Glucose, capillary     Status: Abnormal   Collection Time: 07/25/23  6:01 PM  Result Value Ref Range   Glucose-Capillary 285 (H) 70 - 99 mg/dL    Comment: Glucose reference range applies only to samples taken after fasting for at least 8 hours.  MRSA Next Gen by PCR, Nasal     Status: None   Collection Time: 07/25/23  6:08 PM   Specimen: Nasal Mucosa; Nasal Swab  Result Value Ref Range   MRSA by PCR Next Gen NOT DETECTED NOT DETECTED    Comment: (NOTE) The GeneXpert MRSA Assay (FDA approved for NASAL specimens only), is one component of a comprehensive MRSA colonization surveillance program. It is not intended to diagnose MRSA infection nor to guide or monitor treatment for MRSA infections. Test performance is not FDA approved in patients less than 84 years old. Performed at [facility], [address], [place], [place] [address]   Glucose, capillary     Status: Abnormal   Collection  Time: 07/25/23  7:44 PM  Result Value Ref Range   Glucose-Capillary 277 (H) 70 - 99 mg/dL    Comment: Glucose reference range applies only to samples taken after fasting for at least 8 hours.  Glucose, capillary     Status: Abnormal   Collection Time: 07/25/23 11:55 PM  Result Value Ref Range   Glucose-Capillary 122 (H) 70 - 99 mg/dL    Comment: Glucose reference range applies only to samples taken after fasting for at least 8 hours.  Basic metabolic panel     Status: Abnormal   Collection Time: 07/26/23  1:52 AM  Result Value Ref Range   Sodium 133 (L) 135 -  145 mmol/L    Comment: DELTA CHECK NOTED   Potassium 2.4 (LL) 3.5 - 5.1 mmol/L    Comment: REPEATED TO VERIFY CRITICAL RESULT CALLED TO, READ BACK BY AND VERIFIED WITH Wynona Luna RN @0244  07/26/23 SATRAINR    Chloride 97 (L) 98 - 111 mmol/L   CO2 31 22 - 32 mmol/L   Glucose, Bld 179 (H) 70 - 99 mg/dL    Comment: Glucose reference range applies only to samples taken after fasting for at least 8 hours.   BUN <5 (L) 6 - 20 mg/dL   Creatinine, Ser 9.14 (L) 0.44 - 1.00 mg/dL   Calcium 6.9 (L) 8.9 - 10.3 mg/dL   GFR, Estimated >78 >29 mL/min    Comment: (NOTE) Calculated using the CKD-EPI Creatinine Equation (2021)    Anion gap 5 5 - 15    Comment: Performed at [facility], [address], [place], [place] [address]  CBC     Status: Abnormal   Collection Time: 07/26/23  1:52 AM  Result Value Ref Range   WBC 13.9 (H) 4.0 - 10.5 K/uL   RBC 3.54 (L) 3.87 - 5.11 MIL/uL   Hemoglobin 11.0 (L) 12.0 - 15.0 g/dL   HCT 08.6 (L) 57.8 - 46.9 %   MCV 90.4 80.0 - 100.0 fL   MCH 31.1 26.0 - 34.0 pg   MCHC 34.4 30.0 - 36.0 g/dL   RDW 62.9 52.8 - 41.3 %   Platelets 162 150 - 400 K/uL   nRBC 0.1 0.0 - 0.2 %    Comment: Performed at [facility], [address] [place], [place] [address]  Magnesium     Status: Abnormal   Collection Time: 07/26/23  1:52 AM  Result Value Ref Range   Magnesium 1.6 (L) 1.7 - 2.4 mg/dL     Comment: Performed at [facility], [address], [place], [place] [address]  Phosphorus     Status: Abnormal   Collection Time: 07/26/23  1:52 AM  Result Value Ref Range   Phosphorus <1.0 (LL) 2.5 - 4.6 mg/dL    Comment: REPEATED TO VERIFY CRITICAL RESULT CALLED TO, READ BACK BY AND VERIFIED WITH Wynona Luna RN @0244  07/26/23 SATRAINR Performed at [facility], [address] [place], [place] [address]   Cortisol-am, blood     Status: None   Collection Time: 07/26/23  1:52 AM  Result Value Ref Range   Cortisol - AM 15.3 6.7 - 22.6 ug/dL    Comment: Performed at [facility], [address] [place], [place] [address]  Salicylate level     Status: None   Collection Time: 07/26/23  1:52 AM  Result Value Ref Range   Salicylate Lvl 16.2 7.0 - 30.0 mg/dL    Comment: Performed at [facility], [address] [place], [place] [address]  I-STAT 7, (LYTES, BLD GAS, ICA, H+H)     Status: Abnormal   Collection Time: 07/26/23  3:37 AM  Result Value Ref Range   pH, Arterial 7.411 7.35 - 7.45   pCO2 arterial 48.9 (H) 32 - 48 mmHg   pO2, Arterial 51 (L) 83 - 108 mmHg   Bicarbonate 30.6 (H) 20.0 - 28.0 mmol/L   TCO2 32 22 - 32 mmol/L   O2 Saturation 82 %   Acid-Base Excess 6.0 (H) 0.0 - 2.0 mmol/L   Sodium 135 135 - 145 mmol/L   Potassium 2.6 (LL) 3.5 - 5.1 mmol/L  Calcium, Ion 1.05 (L) 1.15 - 1.40 mmol/L   HCT 33.0 (L) 36.0 - 46.0 %   Hemoglobin 11.2 (L) 12.0 - 15.0 g/dL   Patient temperature 188.4 F    Collection site RADIAL, ALLEN'S TEST ACCEPTABLE    Drawn by RT    Sample type ARTERIAL    Comment NOTIFIED PHYSICIAN   Glucose, capillary     Status: Abnormal   Collection Time: 07/26/23  3:53 AM  Result Value Ref Range   Glucose-Capillary 130 (H) 70 - 99 mg/dL    Comment: Glucose reference range applies only to samples taken after fasting for at least 8 hours.  Glucose, capillary     Status: Abnormal   Collection Time: 07/26/23  8:24 AM  Result Value Ref  Range   Glucose-Capillary 132 (H) 70 - 99 mg/dL    Comment: Glucose reference range applies only to samples taken after fasting for at least 8 hours.  Culture, Respiratory w Gram Stain     Status: None (Preliminary result)   Collection Time: 07/26/23  8:31 AM   Specimen: Tracheal Aspirate; Respiratory  Result Value Ref Range   Specimen Description TRACHEAL ASPIRATE    Special Requests NONE    Gram Stain      ABUNDANT WBC PRESENT, PREDOMINANTLY PMN RARE GRAM POSITIVE COCCI IN PAIRS RARE GRAM NEGATIVE RODS    Culture      CULTURE REINCUBATED FOR BETTER GROWTH Performed at [facility], [address] [place], [place] [address]    Report Status PENDING   Salicylate level     Status: Abnormal   Collection Time: 07/26/23  8:33 AM  Result Value Ref Range   Salicylate Lvl <7.0 (L) 7.0 - 30.0 mg/dL    Comment: Performed at [facility], [address], [place], [place] [address]  I-STAT 7, (LYTES, BLD GAS, ICA, H+H)     Status: Abnormal   Collection Time: 07/26/23  9:23 AM  Result Value Ref Range   pH, Arterial 7.366 7.35 - 7.45   pCO2 arterial 56.9 (H) 32 - 48 mmHg   pO2, Arterial 70 (L) 83 - 108 mmHg   Bicarbonate 32.2 (H) 20.0 - 28.0 mmol/L   TCO2 34 (H) 22 - 32 mmol/L   O2 Saturation 91 %   Acid-Base Excess 6.0 (H) 0.0 - 2.0 mmol/L   Sodium 132 (L) 135 - 145 mmol/L   Potassium 3.5 3.5 - 5.1 mmol/L   Calcium, Ion 1.09 (L) 1.15 - 1.40 mmol/L   HCT 33.0 (L) 36.0 - 46.0 %   Hemoglobin 11.2 (L) 12.0 - 15.0 g/dL   Patient temperature 10.9 C    Collection site RADIAL, ALLEN'S TEST ACCEPTABLE    Drawn by RT    Sample type ARTERIAL   Glucose, capillary     Status: Abnormal   Collection Time: 07/26/23 11:52 AM  Result Value Ref Range   Glucose-Capillary 117 (H) 70 - 99 mg/dL    Comment: Glucose reference range applies only to samples taken after fasting for at least 8 hours.  Basic metabolic panel     Status: Abnormal   Collection Time: 07/26/23 12:51 PM  Result Value  Ref Range   Sodium 134 (L) 135 - 145 mmol/L   Potassium 3.3 (L) 3.5 - 5.1 mmol/L   Chloride 100 98 - 111 mmol/L   CO2 28 22 - 32 mmol/L   Glucose, Bld 117 (H) 70 - 99 mg/dL    Comment: Glucose reference range applies  only to samples taken after fasting for at least 8 hours.   BUN <5 (L) 6 - 20 mg/dL   Creatinine, Ser 1.61 0.44 - 1.00 mg/dL   Calcium 7.0 (L) 8.9 - 10.3 mg/dL   GFR, Estimated >09 >60 mL/min    Comment: (NOTE) Calculated using the CKD-EPI Creatinine Equation (2021)    Anion gap 6 5 - 15    Comment: Performed at [facility], [address] [place], [place] [address]  Magnesium     Status: Abnormal   Collection Time: 07/26/23 12:51 PM  Result Value Ref Range   Magnesium 2.5 (H) 1.7 - 2.4 mg/dL    Comment: Performed at [facility], [address], [place], [place] [address]  Phosphorus     Status: Abnormal   Collection Time: 07/26/23 12:51 PM  Result Value Ref Range   Phosphorus 2.2 (L) 2.5 - 4.6 mg/dL    Comment: Performed at [facility], [address] [place], [place] [address]  Urinalysis, Routine w reflex microscopic -Urine, Catheterized     Status: Abnormal   Collection Time: 07/26/23 12:51 PM  Result Value Ref Range   Color, Urine YELLOW YELLOW   APPearance HAZY (A) CLEAR   Specific Gravity, Urine 1.005 1.005 - 1.030   pH 8.0 5.0 - 8.0   Glucose, UA NEGATIVE NEGATIVE mg/dL   Hgb urine dipstick SMALL (A) NEGATIVE   Bilirubin Urine NEGATIVE NEGATIVE   Ketones, ur NEGATIVE NEGATIVE mg/dL   Protein, ur NEGATIVE NEGATIVE mg/dL   Nitrite NEGATIVE NEGATIVE   Leukocytes,Ua NEGATIVE NEGATIVE   RBC / HPF 0-5 0 - 5 RBC/hpf   WBC, UA 0-5 0 - 5 WBC/hpf   Bacteria, UA RARE (A) NONE SEEN   Squamous Epithelial / HPF 0-5 0 - 5 /HPF   Mucus PRESENT     Comment: Performed at [facility], [address] [place], [place] [address]  Acetaminophen level     Status: Abnormal   Collection Time: 07/26/23  9:26 PM  Result Value Ref Range    Acetaminophen (Tylenol), Serum <10 (L) 10 - 30 ug/mL    Comment: (NOTE) Therapeutic concentrations vary significantly. A range of 10-30 ug/mL  may be an effective concentration for many patients. However, some  are best treated at concentrations outside of this range. Acetaminophen concentrations >150 ug/mL at 4 hours after ingestion  and >50 ug/mL at 12 hours after ingestion are often associated with  toxic reactions.  Performed at [facility], [address], [place], [place] [address]   Salicylate level     Status: Abnormal   Collection Time: 07/26/23  9:26 PM  Result Value Ref Range   Salicylate Lvl <7.0 (L) 7.0 - 30.0 mg/dL    Comment: Performed at [facility], [address], [place], [place] [address]  Basic metabolic panel     Status: Abnormal   Collection Time: 07/26/23  9:26 PM  Result Value Ref Range   Sodium 132 (L) 135 - 145 mmol/L   Potassium 3.4 (L) 3.5 - 5.1 mmol/L   Chloride 97 (L) 98 - 111 mmol/L   CO2 28 22 - 32 mmol/L   Glucose, Bld 124 (H) 70 - 99 mg/dL    Comment: Glucose reference range applies only to samples taken after fasting for at least 8 hours.   BUN <5 (L) 6 - 20 mg/dL   Creatinine, Ser 2.95 (L) 0.44 - 1.00 mg/dL  Calcium 7.4 (L) 8.9 - 10.3 mg/dL   GFR, Estimated >78 >29 mL/min    Comment: (NOTE) Calculated using the CKD-EPI Creatinine Equation (2021)    Anion gap 7 5 - 15    Comment: Performed at [facility], [address], [place], [place] [address]  Magnesium     Status: None   Collection Time: 07/26/23  9:26 PM  Result Value Ref Range   Magnesium 1.8 1.7 - 2.4 mg/dL    Comment: Performed at [facility], [address], [place], [place] [address]  Phosphorus     Status: Abnormal   Collection Time: 07/26/23  9:26 PM  Result Value Ref Range   Phosphorus 2.0 (L) 2.5 - 4.6 mg/dL    Comment: Performed at [facility], [address] [place], [place] [address]  Basic metabolic panel     Status: Abnormal    Collection Time: 07/27/23  4:37 AM  Result Value Ref Range   Sodium 135 135 - 145 mmol/L   Potassium 3.8 3.5 - 5.1 mmol/L   Chloride 101 98 - 111 mmol/L   CO2 25 22 - 32 mmol/L   Glucose, Bld 108 (H) 70 - 99 mg/dL    Comment: Glucose reference range applies only to samples taken after fasting for at least 8 hours.   BUN <5 (L) 6 - 20 mg/dL   Creatinine, Ser 2.95 0.44 - 1.00 mg/dL   Calcium 7.8 (L) 8.9 - 10.3 mg/dL   GFR, Estimated >28 >41 mL/min    Comment: (NOTE) Calculated using the CKD-EPI Creatinine Equation (2021)    Anion gap 9 5 - 15    Comment: Performed at [facility], [address] [place], [place] [address]  Phosphorus     Status: Abnormal   Collection Time: 07/27/23  4:37 AM  Result Value Ref Range   Phosphorus 2.3 (L) 2.5 - 4.6 mg/dL    Comment: Performed at [facility], [address] [place], [place] [address]  Magnesium     Status: None   Collection Time: 07/27/23  4:37 AM  Result Value Ref Range   Magnesium 2.1 1.7 - 2.4 mg/dL    Comment: Performed at [facility], [address] [place], [place] [address]  CBC     Status: Abnormal   Collection Time: 07/27/23  4:37 AM  Result Value Ref Range   WBC 10.6 (H) 4.0 - 10.5 K/uL   RBC 3.77 (L) 3.87 - 5.11 MIL/uL   Hemoglobin 11.5 (L) 12.0 - 15.0 g/dL   HCT 40.3 (L) 47.4 - 25.9 %   MCV 93.9 80.0 - 100.0 fL   MCH 30.5 26.0 - 34.0 pg   MCHC 32.5 30.0 - 36.0 g/dL   RDW 56.3 87.5 - 64.3 %   Platelets 131 (L) 150 - 400 K/uL    Comment: REPEATED TO VERIFY   nRBC 0.0 0.0 - 0.2 %    Comment: Performed at [facility], [address] [place], [place] [address]  Glucose, capillary     Status: None   Collection Time: 07/27/23  7:37 AM  Result Value Ref Range   Glucose-Capillary 99 70 - 99 mg/dL    Comment: Glucose reference range applies only to samples taken after fasting for at least 8 hours.   EEG adult Result Date: 07/26/2023 Charlsie Quest, MD     07/26/2023  2:55 PM Patient Name:  Charika Mikelson MRN: [id number]  Epilepsy Attending: Charlsie Quest Referring Physician/Provider: Talitha Givens, NP Date: 07/26/2023 Duration: 23.30 mins Patient history: 43yo F with ams. EEG to evaluate for seizure Level of alertness: Awake/ lethargic AEDs during EEG study: None Technical aspects: This EEG study was done with scalp electrodes positioned according to the 10-20 International system of electrode placement. Electrical activity was reviewed with band pass filter of 1-70Hz , sensitivity of 7 uV/mm, display speed of 63mm/sec with a 60Hz  notched filter applied as appropriate. EEG data were recorded continuously and digitally stored.  Video monitoring was available and reviewed as appropriate. Description: No posterior dominant rhythm was seen. EEG showed continuous generalized high amplitude 3 to 6 Hz theta-delta slowing with triphasic morphology. Hyperventilation and photic stimulation were not performed.   ABNORMALITY - Continuous slow, generalized IMPRESSION: This study is suggestive of moderate diffuse encephalopathy likely related to toxic-metabolic causes.  No seizures or epileptiform discharges were seen throughout the recording. Charlsie Quest   DG CHEST PORT 1 VIEW Result Date: 07/26/2023 CLINICAL DATA:  Hypoxia. EXAM: PORTABLE CHEST 1 VIEW COMPARISON:  Radiograph and CT yesterday FINDINGS: Stable positioning of right central line. Increasing heterogeneous bilateral lung opacities, particular increase in the left mid and lower lung zone. The heart is normal in size. No pleural effusion or pneumothorax. IMPRESSION: Increasing heterogeneous bilateral lung opacities, particular increase in the left mid and lower lung zone. Differential considerations include edema or infection. Electronically Signed   By: Narda Rutherford M.D.   On: 07/26/2023 11:38   DG CHEST PORT 1 VIEW Result Date: 07/25/2023 CLINICAL DATA:  [id number] Central venous catheter in place [id number] EXAM: PORTABLE CHEST - 1 VIEW  COMPARISON:  Earlier film of the same day FINDINGS: Worsening patchy mid lung airspace opacities right greater than left. Progressive peripheral consolidation in the left upper lobe. Heart size and mediastinal contours are within normal limits. Right IJ central line placement to the distal SVC. No pneumothorax. No effusion. Visualized bones unremarkable. IMPRESSION: 1. Right IJ central line placement to the distal SVC. No pneumothorax. 2. Worsening bilateral airspace disease. Electronically Signed   By: Corlis Leak M.D.   On: 07/25/2023 19:11   CT Angio Chest PE W and/or Wo Contrast Result Date: 07/25/2023 CLINICAL DATA:  Pulmonary embolism (PE) suspected, high prob Cough with shortness of breath for 2 days.  Altered mental status. EXAM: CT ANGIOGRAPHY CHEST WITH CONTRAST TECHNIQUE: Multidetector CT imaging of the chest was performed using the standard protocol during bolus administration of intravenous contrast. Multiplanar CT image reconstructions and MIPs were obtained to evaluate the vascular anatomy. RADIATION DOSE REDUCTION: This exam was performed according to the departmental dose-optimization program which includes automated exposure control, adjustment of the mA and/or kV according to patient size and/or use of iterative reconstruction technique. CONTRAST:  75mL OMNIPAQUE IOHEXOL 350 MG/ML SOLN COMPARISON:  Radiographs 07/25/2023 and 07/02/2006. FINDINGS: Technical note: Despite efforts by the technologist and patient, mild motion artifact is present on today's exam and could not be eliminated. This reduces exam sensitivity and specificity. Cardiovascular: The pulmonary arteries are adequately opacified with contrast to the level of the segmental branches. There is no evidence of acute pulmonary embolism. No acute systemic arterial abnormalities are identified. There is mild reflux of contrast into the hepatic veins. The heart size is normal. There is no pericardial effusion. Mediastinum/Nodes: There are  no enlarged mediastinal, hilar or axillary lymph nodes. The thyroid gland, trachea and esophagus demonstrate no significant findings. Lungs/Pleura: No pleural effusion or pneumothorax. There are patchy  ground-glass opacities anteriorly in both upper lobes which may be secondary to atypical infection or asymmetric edema. There is also mild involvement peripherally in the left lower lobe. No consolidation or suspicious pulmonary nodularity. Upper abdomen: No acute or significant findings are seen in the visualized upper abdomen. Probable 1.5 cm cyst in the upper pole of the left kidney for which no specific follow-up imaging is recommended. Musculoskeletal/Chest wall: There is no chest wall mass or suspicious osseous finding. Review of the MIP images confirms the above findings. IMPRESSION: 1. No evidence of acute pulmonary embolism or other acute vascular findings in the chest. 2. Patchy ground-glass opacities anteriorly in both upper lobes and peripherally in the left lower lobe, possibly secondary to atypical infection or asymmetric edema. No consolidation or pleural effusion. Electronically Signed   By: Carey Bullocks M.D.   On: 07/25/2023 12:20   CT HEAD WO CONTRAST ( ) Result Date: 07/25/2023 CLINICAL DATA:  Neurologic deficit. EXAM: CT HEAD WITHOUT CONTRAST TECHNIQUE: Contiguous axial images were obtained from the base of the skull through the vertex without intravenous contrast. RADIATION DOSE REDUCTION: This exam was performed according to the departmental dose-optimization program which includes automated exposure control, adjustment of the mA and/or kV according to patient size and/or use of iterative reconstruction technique. COMPARISON:  None Available. FINDINGS: Brain: The ventricles and sulci appropriate size for patient's age. Small area of low attenuation involving the medial left cerebellar hemisphere suspicious for a small old infarct and encephalomalacia. There is no acute intracranial  hemorrhage. No mass effect or midline shift. No extra-axial fluid collection. Vascular: No hyperdense vessel or unexpected calcification. Skull: Normal. Negative for fracture or focal lesion. Sinuses/Orbits: No acute finding. Other: None IMPRESSION: 1. No acute intracranial pathology. 2. 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 Electronically Signed   By: Elgie Collard M.D.   On: 07/25/2023 12:16    PMH:   Past Medical History:  Diagnosis Date   ADHD    Hypertension    Kidney stone     PSH:  History reviewed. No pertinent surgical history.  Allergies:  Allergies  Allergen Reactions   Sulfamethoxazole-Trimethoprim Hives and Itching    Medications:   Prior to Admission medications   Medication Sig Start Date End Date Taking? Authorizing Provider  cetirizine (ZYRTEC) 10 MG tablet Take 10 mg by mouth daily as needed for allergies.   Yes [provider]  magic mouthwash (nystatin, lidocaine, diphenhydrAMINE, alum & mag hydroxide) suspension Swish and swallow 5 mLs 4 (four) times daily as needed for mouth pain. 12/06/22  Yes Valinda Hoar, NP  metoprolol tartrate (LOPRESSOR) 50 MG tablet Take 1 tablet by mouth twice daily 02/24/23  Yes Zonia Kief, Amy J, NP    Discontinued Meds:   Medications Discontinued During This Encounter  Medication Reason   ziprasidone (GEODON) injection 20 mg    LORazepam (ATIVAN) injection 1 mg    ziprasidone (GEODON) injection 20 mg    LORazepam (ATIVAN) injection 2 mg Completed Course   heparin injection 1,000 Units    heparin lock flush 100 unit/mL    cefTRIAXone (ROCEPHIN) 2 g in sodium chloride 0.9 % 100 mL IVPB    sodium bicarbonate 150 mEq in dextrose 5 % 1,150 mL infusion    enoxaparin (LOVENOX) injection 40 mg    dextrose 10 % infusion    Chlorhexidine Gluconate Cloth 2 % PADS 6 each Duplicate    Social History:  reports that she has been smoking cigarettes.  She started smoking about 2  years ago. She has a 1.1 pack-year smoking history. She has never used smokeless tobacco. She reports that she does not currently use alcohol. She reports that she does not currently use drugs.  Family History:  History reviewed. No pertinent family history.  Blood pressure 96/61, pulse 95, temperature 100.2 F (37.9 C), resp. rate (!) 22, height 5\' 4"  (1.626 m), weight 61.5 kg, last menstrual period 07/22/2023, SpO2 100%. Physical Exam: General: Well-appearing, no apparent distress, conversing appropriately  Cardiovascular: RRR Respiratory: no resp distress; CTA Abdomen: soft, NTTP, no rebound or guarding Extremities: Moving all 4 extremities      Kanita Delage, Len Blalock, MD 07/27/2023, 11:16 AM

## 2023-07-27 NOTE — Progress Notes (Addendum)
 NAMEValery Deleon, MRN:  [id number], DOB:  August 09, 1980, LOS: 2 ADMISSION DATE:  07/25/2023, CONSULTATION DATE:  3/18 REFERRING MD:  Sol Passer CHIEF COMPLAINT: Altered mental status  History of Present Illness:  Linda Deleon is a 43 year old female with a history of hypertension, ADHD, who presented to the ED with altered mental status.  She lives with her mother who is at bedside.  History is provided by mother as patient is encephalopathic.  Episode started about 4 days ago, was not acting like himself, with difficulty hearing, and acute confusion.  No new medicines.  Had some mild nausea, no vomiting.  She has had a dry cough, no URI symptoms, no episodes of aspiration. Symptoms progressively worsened on the weekend, prompting them to go to the [facility] on Monday.  Left due to long wait times.  She has had mild appetite denies diarrhea, seizures or urinary symptoms.  No psychiatric disease.Notably, she has a history of back pain and headaches and takes Goody powder for it.  She has been picking increased amount due to ongoing back pain and headaches.  The ED, WBCs 13, VBG 7.38/23.6/14.1, elevated AG Ethanol and acetaminophen normal.  Salicylate 92. UDS + benzo(given in the ED.)  CT of chest with right upper lobe consolidation. BHB 0.89, UA with mild ketones, negative nitrites.  Pertinent  Medical History  Hypertension, ADHD.  Significant Hospital Events: Including procedures, antibiotic start and stop dates in addition to other pertinent events   3/18: Admitted to the ICU 3/19 Mentation remains poor this am with increased oxygen demand, now on a NRB. CXR this am pending  3/20 underwent additional HD overnight and mentation is drastically improved this a.m., she is alert and interactive and requesting something to drink and to brush her teeth  Interim History / Subjective:  Alert and interactive this a.m. with appropriate request to brush teeth and joint  Objective    Blood pressure 106/66, pulse 79, temperature (!) 100.4 F (38 C), resp. rate 19, height 5\' 4"  (1.626 m), weight 61.5 kg, last menstrual period 07/22/2023, SpO2 100%.    FiO2 (%):  [30 %-73 %] 30 %   Intake/Output Summary (Last 24 hours) at 07/27/2023 1020 Last data filed at 07/27/2023 0500 Gross per 24 hour  Intake 732.14 ml  Output 3300 ml  Net -2567.86 ml   Filed Weights   07/26/23 0500 07/26/23 1703 07/26/23 2021  Weight: 61.3 kg 61.5 kg 61.5 kg    Examination: General: Acute ill-appearing adult female lying in bed in no acute distress HEENT: Linda Deleon/AT, MM pink/moist, PERRL,  Neuro: Alert and interactive, confused to situation and time CV: s1s2 regular rate and rhythm, no murmur, rubs, or gallops,  PULM: Clear to auscultation bilaterally, productive cough, no increased work of breathing, on 7 L high flow GI: soft, bowel sounds active in all 4 quadrants, non-tender, non-distended Extremities: warm/dry, no edema  Skin: no rashes or lesions   Resolved Hospital Problem list   Anion gap metabolic acidosis  Assessment & Plan:  Severe salicylate toxicity  -Salicylate level 92.5 on admission, corrected to 16.2 post HD. -Unintentional overdose, 2/2 to Goody powder for back pain.  P: Nephrology following, appreciate assistance S/p HD 3/18 and 3/19, mentation drastically improved no ongoing indications to dialyze again Optimize electrolytes  Acute encephalopathy - improved  -Presenting with acute encephalopathy, tinnitus and nausea felt secondary to severe salicylate toxicity however a.m. 3/19 mentation continues to remain poor despite correction of elevated salicylate levels -Head CT with  small area of low-attenuation involving the medial left cerebellar hemisphere suspicious for small old infarct and encephalomalacia -Spot EEG negative P: Given drastic change in mentation will hold on MRI Special precautions Neuroprotective measures Nutrition and bowel regiment Seizure  precautions  Acute hypoxic and hypercapnic respiratory failure Community-acquired pneumonia -CTA chest with patchy ground-glass opacities anteriorly in both upper lobes and peripherally in the left lower lobe, possibly secondary to atypical infection.  P: With improvement in mentation pulmonary mechanics have also drastically improved, supplemental oxygen requirement decreased Aspiration precautions Continue ceftriaxone and azithromycin with stop dates in place  Hypertension -Home regimen includes metoprolol, and lisinopril P: Home medications remain on hold  Hypophosphatemia P: Supplement  If patient remains stable can transfer out of ICU and to hospitalist service tomorrow  Best Practice (right click and "Reselect all SmartList Selections" daily)   Diet/type: NPO DVT prophylaxis: DOAC GI prophylaxis: H2B Lines: Central line Foley:  Yes, and it is still needed Code Status:  full code Last date of multidisciplinary goals of care discussion [none]  Critical care: NA   Whitney D. Harris, NP-C [facility] Personal contact information can be found on Amion  If no contact or response made please call [phone] 07/27/2023, 10:20 AM    I personally saw the patient and performed a substantive portion of this encounter, including a complete performance of at least one of the key components (MDM, Hx and/or Exam), in conjunction with the Advanced Practice Provider.   43 year old woman presenting with acute encephalopathy, metabolic acidosis with anion gap respiratory alkalosis, salicylate level of 92, underwent emergent dialysis with salicylate levels decreasing  She received a second round of dialysis 3/19.  Mental status improved today, remains somnolent, moves all 4 extremity, occasional response relevant Low-grade febrile , down to 40% FiO2 On exam -no accessory muscle use, decreased rhonchi, S1-S2 regular, soft nontender abdomen.  Labs show hypophosphatemia,  decreased leukocytosis Sputum culture shows GNR.  Impression/plan Acute encephalopathy -improved, head CT shows old cerebellar infarct, EEG negative for seizures  Salicylate toxicity -levels normalized does not need anymore dialysis.  Acute respiratory failure with hypoxia -presumed aspiration pneumonia, oxygenation improving , continue empiric ceftriaxone, follow sputum culture.  Hypophosphatemia will be repleted. Brother updated at bedside. Comer Locket Vassie Loll MD

## 2023-07-27 NOTE — Progress Notes (Signed)
 Assessment/Plan: Salicylate toxicity  Mixed metabolic acidosis-respiratory alkalosis - Salicylate levels improved now status post dialysis x2. Reassuring, that respiratory status is improving, and likely AMS now also moving in positive direction.  Suspect will not need 3rd session of dialysis Metabolic encephalopathy 2/2 secondary to salicylate toxicity - Improving, management as above. Work-up for other causes per primary team.  CAP - Per primary on Abx.   Subjective: Repeat dialysis session yesterday afternoon. Patient's respiratory status improving this AM. Now off of high flow mask and only on HFNC. Per nursing this morning, mild improvement in mental status. Patient did verbalized that she new she was in the hospital. No verbal communication on my visit.  Objective:  Chemistry and CBC: Creatinine, Ser  Date/Time Value Ref Range Status  07/27/2023 04:37 AM 0.52 0.44 - 1.00 mg/dL Final  56/43/3295 18:84 PM 0.35 (L) 0.44 - 1.00 mg/dL Final  16/60/6301 60:10 PM 0.50 0.44 - 1.00 mg/dL Final  93/23/5573 22:02 AM 0.43 (L) 0.44 - 1.00 mg/dL Final  54/27/0623 76:28 AM 0.92 0.44 - 1.00 mg/dL Final  31/51/7616 07:37 PM 0.66 0.44 - 1.00 mg/dL Final   Recent Labs  Lab 07/25/23 0820 07/25/23 0853 07/25/23 1238 07/26/23 0152 07/26/23 0337 07/26/23 0923 07/26/23 1251 07/26/23 2126 07/27/23 0437  NA 140   < > 143 133* 135 132* 134* 132* 135  K 4.0   < > 3.9 2.4* 2.6* 3.5 3.3* 3.4* 3.8  CL 109  --   --  97*  --   --  100 97* 101  CO2 14*  --   --  31  --   --  28 28 25   GLUCOSE 109*  --   --  179*  --   --  117* 124* 108*  BUN 20  --   --  <5*  --   --  <5* <5* <5*  CREATININE 0.92  --   --  0.43*  --   --  0.50 0.35* 0.52  CALCIUM 8.4*  --   --  6.9*  --   --  7.0* 7.4* 7.8*  PHOS  --   --   --  <1.0*  --   --  2.2* 2.0* 2.3*   < > = values in this interval not displayed.   Recent Labs  Lab 07/25/23 0820 07/25/23 0853 07/25/23 1754 07/26/23 0152 07/26/23 0337 07/26/23 0923  07/27/23 0437  WBC 11.7*  --  11.9* 13.9*  --   --  10.6*  NEUTROABS 10.2*  --   --   --   --   --   --   HGB 14.8   < > 10.9* 11.0* 11.2* 11.2* 11.5*  HCT 44.2   < > 31.8* 32.0* 33.0* 33.0* 35.4*  MCV 91.5  --  91.6 90.4  --   --  93.9  PLT 233  --  174 162  --   --  131*   < > = values in this interval not displayed.   Liver Function Tests: Recent Labs  Lab 07/25/23 0820  AST 32  ALT 22  ALKPHOS 60  BILITOT 0.7  PROT 7.8  ALBUMIN 4.0   Recent Labs  Lab 07/25/23 0820  LIPASE 21   Recent Labs  Lab 07/25/23 0821  AMMONIA 17   Cardiac Enzymes: Recent Labs  Lab 07/25/23 0820  CKTOTAL 158   Iron Studies: No results for input(s): "IRON", "TIBC", "TRANSFERRIN", "FERRITIN" in the last 72 hours. PT/INR: @LABRCNTIP (inr:5)  Xrays/Other Studies: )  Results for orders placed or performed during the hospital encounter of 07/25/23 (from the past 48 hours)  hCG, quantitative, pregnancy     Status: None   Collection Time: 07/25/23  8:21 AM  Result Value Ref Range   hCG, Beta Chain, Quant, S <1 <5 mIU/mL    Comment:          GEST. AGE      CONC.  (mIU/mL)   <=1 WEEK        5 - 50     2 WEEKS       50 - 500     3 WEEKS       100 - 10,000     4 WEEKS     1,000 - 30,000     5 WEEKS     3,500 - 115,000   6-8 WEEKS     12,000 - 270,000    12 WEEKS     15,000 - 220,000        FEMALE AND NON-PREGNANT FEMALE:     LESS THAN 5 mIU/mL Performed at [facility], [address] [place], [place] [address]   Resp panel by RT-PCR (RSV, Flu A&B, Covid) Anterior Nasal Swab     Status: None   Collection Time: 07/25/23  8:21 AM   Specimen: Anterior Nasal Swab  Result Value Ref Range   SARS Coronavirus 2 by RT PCR NEGATIVE NEGATIVE    Comment: (NOTE) SARS-CoV-2 target nucleic acids are NOT DETECTED.  The SARS-CoV-2 RNA is generally detectable in upper respiratory specimens during the acute phase of infection. The lowest concentration of SARS-CoV-2 viral copies this assay  can detect is 138 copies/mL. A negative result does not preclude SARS-Cov-2 infection and should not be used as the sole basis for treatment or other patient management decisions. A negative result may occur with  improper specimen collection/handling, submission of specimen other than nasopharyngeal swab, presence of viral mutation(s) within the areas targeted by this assay, and inadequate number of viral copies(<138 copies/mL). A negative result must be combined with clinical observations, patient history, and epidemiological information. The expected result is Negative.  Fact Sheet for Patients:  BloggerCourse.com  Fact Sheet for Healthcare Providers:  SeriousBroker.it  This test is no t yet approved or cleared by the [place] FDA and  has been authorized for detection and/or diagnosis of SARS-CoV-2 by FDA under an Emergency Use Authorization (EUA). This EUA will remain  in effect (meaning this test can be used) for the duration of the COVID-19 declaration under Section 564(b)(1) of the Act, 21 U.S.C.section 360bbb-3(b)(1), unless the authorization is terminated  or revoked sooner.       Influenza A by PCR NEGATIVE NEGATIVE   Influenza B by PCR NEGATIVE NEGATIVE    Comment: (NOTE) The Xpert Xpress SARS-CoV-2/FLU/RSV plus assay is intended as an aid in the diagnosis of influenza from Nasopharyngeal swab specimens and should not be used as a sole basis for treatment. Nasal washings and aspirates are unacceptable for Xpert Xpress SARS-CoV-2/FLU/RSV testing.  Fact Sheet for Patients: BloggerCourse.com  Fact Sheet for Healthcare Providers: SeriousBroker.it  This test is not yet approved or cleared by the [place] FDA and has been authorized for detection and/or diagnosis of SARS-CoV-2 by FDA under an Emergency Use Authorization (EUA). This EUA will remain in effect  (meaning this test can be used) for the duration of the COVID-19 declaration under Section 564(b)(1) of the Act, 21 U.S.C. section 360bbb-3(b)(1), unless the  authorization is terminated or revoked.     Resp Syncytial Virus by PCR NEGATIVE NEGATIVE    Comment: (NOTE) Fact Sheet for Patients: BloggerCourse.com  Fact Sheet for Healthcare Providers: SeriousBroker.it  This test is not yet approved or cleared by the [place] FDA and has been authorized for detection and/or diagnosis of SARS-CoV-2 by FDA under an Emergency Use Authorization (EUA). This EUA will remain in effect (meaning this test can be used) for the duration of the COVID-19 declaration under Section 564(b)(1) of the Act, 21 U.S.C. section 360bbb-3(b)(1), unless the authorization is terminated or revoked.  Performed at [facility], [address] [place], [place] [address]   TSH     Status: Abnormal   Collection Time: 07/25/23  8:21 AM  Result Value Ref Range   TSH 0.194 (L) 0.350 - 4.500 uIU/mL    Comment: Performed by a 3rd Generation assay with a functional sensitivity of <=0.01 uIU/mL. Performed at [facility], [address] [place], [place] [address]   T4, free     Status: Abnormal   Collection Time: 07/25/23  8:21 AM  Result Value Ref Range   Free T4 0.57 (L) 0.61 - 1.12 ng/dL    Comment: (NOTE) Biotin ingestion may interfere with free T4 tests. If the results are inconsistent with the TSH level, previous test results, or the clinical presentation, then consider biotin interference. If needed, order repeat testing after stopping biotin. Performed at [facility], [address] [place], [place] [address]   Ammonia     Status: None   Collection Time: 07/25/23  8:21 AM  Result Value Ref Range   Ammonia 17 9 - 35 umol/L    Comment: Performed at [facility], [address] [place], [place] [address]  I-Stat  venous blood gas, ED     Status: Abnormal   Collection Time: 07/25/23  8:53 AM  Result Value Ref Range   pH, Ven 7.381 7.25 - 7.43   pCO2, Ven 23.6 (L) 44 - 60 mmHg   pO2, Ven 17 (LL) 32 - 45 mmHg   Bicarbonate 14.1 (L) 20.0 - 28.0 mmol/L   TCO2 15 (L) 22 - 32 mmol/L   O2 Saturation 27 %   Acid-base deficit 9.0 (H) 0.0 - 2.0 mmol/L   Sodium 141 135 - 145 mmol/L   Potassium 4.1 3.5 - 5.1 mmol/L   Calcium, Ion 0.82 (LL) 1.15 - 1.40 mmol/L   HCT 46.0 36.0 - 46.0 %   Hemoglobin 15.6 (H) 12.0 - 15.0 g/dL   Patient temperature 29.5 F    Collection site IV start    Drawn by Nurse    Sample type VENOUS    Comment NOTIFIED PHYSICIAN   Lactic acid, plasma     Status: None   Collection Time: 07/25/23  8:59 AM  Result Value Ref Range   Lactic Acid, Venous 0.8 0.5 - 1.9 mmol/L    Comment: Performed at [facility], [address] [place], [place] [address]  POC CBG, ED     Status: Abnormal   Collection Time: 07/25/23  9:10 AM  Result Value Ref Range   Glucose-Capillary 113 (H) 70 - 99 mg/dL    Comment: Glucose reference range applies only to samples taken after fasting for at least 8 hours.  Blood culture (routine x 2)     Status: None (Preliminary result)   Collection Time: 07/25/23  9:58 AM  Specimen: BLOOD LEFT HAND  Result Value Ref Range   Specimen Description      BLOOD LEFT HAND Performed at [facility], [address] [place], [place] [address]    Special Requests      BOTTLES DRAWN AEROBIC ONLY Blood Culture results may not be optimal due to an inadequate volume of blood received in culture bottles Performed at [facility], [address] [place], [place] [address]    Culture      NO GROWTH 2 DAYS Performed at [facility], [address], [place], [place] [address]    Report Status PENDING   Rapid urine drug screen (hospital performed)     Status: Abnormal   Collection Time: 07/25/23 10:00 AM  Result Value Ref Range   Opiates  NONE DETECTED NONE DETECTED   Cocaine NONE DETECTED NONE DETECTED   Benzodiazepines POSITIVE (A) NONE DETECTED   Amphetamines NONE DETECTED NONE DETECTED   Tetrahydrocannabinol NONE DETECTED NONE DETECTED   Barbiturates NONE DETECTED NONE DETECTED    Comment: (NOTE) DRUG SCREEN FOR MEDICAL PURPOSES ONLY.  IF CONFIRMATION IS NEEDED FOR ANY PURPOSE, NOTIFY LAB WITHIN 5 DAYS.  LOWEST DETECTABLE LIMITS FOR URINE DRUG SCREEN Drug Class                     Cutoff (ng/mL) Amphetamine and metabolites    1000 Barbiturate and metabolites    200 Benzodiazepine                 200 Opiates and metabolites        300 Cocaine and metabolites        300 THC                            50 Performed at [facility], [address] [place], [place] [address]   Urinalysis, Routine w reflex microscopic -Urine, Clean Catch     Status: Abnormal   Collection Time: 07/25/23 10:00 AM  Result Value Ref Range   Color, Urine YELLOW YELLOW   APPearance CLOUDY (A) CLEAR   Specific Gravity, Urine >=1.030 1.005 - 1.030   pH 5.0 5.0 - 8.0   Glucose, UA NEGATIVE NEGATIVE mg/dL   Hgb urine dipstick LARGE (A) NEGATIVE   Bilirubin Urine NEGATIVE NEGATIVE   Ketones, ur 80 (A) NEGATIVE mg/dL   Protein, ur 322 (A) NEGATIVE mg/dL   Nitrite NEGATIVE NEGATIVE   Leukocytes,Ua SMALL (A) NEGATIVE    Comment: Performed at [facility], [address] [place], [place] [address]  Urinalysis, Microscopic (reflex)     Status: Abnormal   Collection Time: 07/25/23 10:00 AM  Result Value Ref Range   RBC / HPF 6-10 0 - 5 RBC/hpf   WBC, UA 11-20 0 - 5 WBC/hpf   Bacteria, UA FEW (A) NONE SEEN   Squamous Epithelial / HPF 0-5 0 - 5 /HPF   WBC Clumps PRESENT    Hyaline Casts, UA PRESENT    Amorphous [place] PRESENT     Comment: Performed at [facility], [address] [place], [place] [address]  Blood culture (routine x 2)     Status: None (Preliminary result)   Collection Time: 07/25/23  10:02 AM   Specimen: BLOOD RIGHT FOREARM  Result Value Ref Range   Specimen Description      BLOOD RIGHT FOREARM Performed  at [facility], [address] [place], [place] [address]    Special Requests      BOTTLES DRAWN AEROBIC AND ANAEROBIC Blood Culture adequate volume Performed at [facility], [address] [place], [place] [address]    Culture      NO GROWTH 2 DAYS Performed at [facility], [address], [place], [place] [address]    Report Status PENDING   Salicylate level     Status: Abnormal   Collection Time: 07/25/23 12:17 PM  Result Value Ref Range   Salicylate Lvl 92.5 (HH) 7.0 - 30.0 mg/dL    Comment: CRITICAL RESULT CALLED TO, READ BACK BY AND VERIFIED WITH ALI NOAH RN AT 1342 ON 07/25/23 BY [email or web address] Performed at [facility], [address] [place], [place] [address]   Acetaminophen level     Status: Abnormal   Collection Time: 07/25/23 12:17 PM  Result Value Ref Range   Acetaminophen (Tylenol), Serum <10 (L) 10 - 30 ug/mL    Comment: (NOTE) Therapeutic concentrations vary significantly. A range of 10-30 ug/mL  may be an effective concentration for many patients. However, some  are best treated at concentrations outside of this range. Acetaminophen concentrations >150 ug/mL at 4 hours after ingestion  and >50 ug/mL at 12 hours after ingestion are often associated with  toxic reactions.  Performed at [facility], [address] [place], [place] [address]   Osmolality     Status: Abnormal   Collection Time: 07/25/23 12:17 PM  Result Value Ref Range   Osmolality 297 (H) 275 - 295 mOsm/kg    Comment: REPEATED TO VERIFY Performed at [facility], [address], [place], [place] [address]   Beta-hydroxybutyric acid     Status: Abnormal   Collection Time: 07/25/23 12:17 PM  Result Value Ref Range   Beta-Hydroxybutyric Acid 0.89 (H) 0.05 - 0.27 mmol/L    Comment: Performed at [facility], [address], [place], [place] [address]  I-Stat venous blood gas, ED     Status: Abnormal   Collection Time: 07/25/23 12:38 PM  Result Value Ref Range   pH, Ven 7.346 7.25 - 7.43   pCO2, Ven 17.7 (LL) 44 - 60 mmHg   pO2, Ven 59 (H) 32 - 45 mmHg   Bicarbonate 9.8 (L) 20.0 - 28.0 mmol/L   TCO2 10 (L) 22 - 32 mmol/L   O2 Saturation 91 %   Acid-base deficit 14.0 (H) 0.0 - 2.0 mmol/L   Sodium 143 135 - 145 mmol/L   Potassium 3.9 3.5 - 5.1 mmol/L   Calcium, Ion 0.76 (LL) 1.15 - 1.40 mmol/L   HCT 35.0 (L) 36.0 - 46.0 %   Hemoglobin 11.9 (L) 12.0 - 15.0 g/dL   Patient temperature 74.2 F    Collection site IV start    Drawn by Nurse    Sample type VENOUS    Comment NOTIFIED PHYSICIAN   Glucose, capillary     Status: Abnormal   Collection Time: 07/25/23  4:27 PM  Result Value Ref Range   Glucose-Capillary 254 (H) 70 - 99 mg/dL    Comment: Glucose reference range applies only to samples taken after fasting for at least 8 hours.  HIV Antibody (routine testing w rflx)     Status: None   Collection Time: 07/25/23  5:54 PM  Result Value Ref Range   HIV Screen 4th Generation wRfx  Non Reactive Non Reactive    Comment: Performed at [facility], [address], [place], [place] [address]  CBC     Status: Abnormal   Collection Time: 07/25/23  5:54 PM  Result Value Ref Range   WBC 11.9 (H) 4.0 - 10.5 K/uL   RBC 3.47 (L) 3.87 - 5.11 MIL/uL   Hemoglobin 10.9 (L) 12.0 - 15.0 g/dL   HCT 88.4 (L) 16.6 - 06.3 %   MCV 91.6 80.0 - 100.0 fL   MCH 31.4 26.0 - 34.0 pg   MCHC 34.3 30.0 - 36.0 g/dL   RDW 01.6 01.0 - 93.2 %   Platelets 174 150 - 400 K/uL   nRBC 0.0 0.0 - 0.2 %    Comment: Performed at [facility], [address] [place], [place] [address]  Hepatitis B surface antibody,quantitative     Status: Abnormal   Collection Time: 07/25/23  5:54 PM  Result Value Ref Range   Hep B S AB Quant (Post) <3.5 (L) Immunity>10 mIU/mL    Comment: (NOTE)  Status of Immunity                      Anti-HBs Level  ------------------                     -------------- Inconsistent with Immunity                  0.0 - 10.0 Consistent with Immunity                         >10.0 Performed At: [facility] [address] [place], [place] [id number] Jolene Schimke MD [id number]   Hepatitis B surface antigen     Status: None   Collection Time: 07/25/23  5:54 PM  Result Value Ref Range   Hepatitis B Surface Ag NON REACTIVE NON REACTIVE    Comment: Performed at [facility], [address], [place], [place] [address]  Glucose, capillary     Status: Abnormal   Collection Time: 07/25/23  6:01 PM  Result Value Ref Range   Glucose-Capillary 285 (H) 70 - 99 mg/dL    Comment: Glucose reference range applies only to samples taken after fasting for at least 8 hours.  MRSA Next Gen by PCR, Nasal     Status: None   Collection Time: 07/25/23  6:08 PM   Specimen: Nasal Mucosa; Nasal Swab  Result Value Ref Range   MRSA by PCR Next Gen NOT DETECTED NOT DETECTED    Comment: (NOTE) The GeneXpert MRSA Assay (FDA approved for NASAL specimens only), is one component of a comprehensive MRSA colonization surveillance program. It is not intended to diagnose MRSA infection nor to guide or monitor treatment for MRSA infections. Test performance is not FDA approved in patients less than 39 years old. Performed at [facility], [address] [place], [place] [address]   Glucose, capillary     Status: Abnormal   Collection Time: 07/25/23  7:44 PM  Result Value Ref Range   Glucose-Capillary 277 (H) 70 - 99 mg/dL    Comment: Glucose reference range applies only to samples taken after fasting for at least 8 hours.  Glucose, capillary     Status: Abnormal   Collection Time: 07/25/23 11:55 PM  Result Value Ref Range   Glucose-Capillary 122 (H) 70 - 99 mg/dL    Comment: Glucose  reference range applies only to samples taken after fasting for at least 8 hours.  Basic metabolic panel      Status: Abnormal   Collection Time: 07/26/23  1:52 AM  Result Value Ref Range   Sodium 133 (L) 135 - 145 mmol/L    Comment: DELTA CHECK NOTED   Potassium 2.4 (LL) 3.5 - 5.1 mmol/L    Comment: REPEATED TO VERIFY CRITICAL RESULT CALLED TO, READ BACK BY AND VERIFIED WITH Wynona Luna RN @0244  07/26/23 SATRAINR    Chloride 97 (L) 98 - 111 mmol/L   CO2 31 22 - 32 mmol/L   Glucose, Bld 179 (H) 70 - 99 mg/dL    Comment: Glucose reference range applies only to samples taken after fasting for at least 8 hours.   BUN <5 (L) 6 - 20 mg/dL   Creatinine, Ser 8.75 (L) 0.44 - 1.00 mg/dL   Calcium 6.9 (L) 8.9 - 10.3 mg/dL   GFR, Estimated >64 >33 mL/min    Comment: (NOTE) Calculated using the CKD-EPI Creatinine Equation (2021)    Anion gap 5 5 - 15    Comment: Performed at [facility], [address] [place], [place] [address]  CBC     Status: Abnormal   Collection Time: 07/26/23  1:52 AM  Result Value Ref Range   WBC 13.9 (H) 4.0 - 10.5 K/uL   RBC 3.54 (L) 3.87 - 5.11 MIL/uL   Hemoglobin 11.0 (L) 12.0 - 15.0 g/dL   HCT 84.1 (L) 66.0 - 63.0 %   MCV 90.4 80.0 - 100.0 fL   MCH 31.1 26.0 - 34.0 pg   MCHC 34.4 30.0 - 36.0 g/dL   RDW 16.0 10.9 - 32.3 %   Platelets 162 150 - 400 K/uL   nRBC 0.1 0.0 - 0.2 %    Comment: Performed at [facility], [address], [place], [place] [address]  Magnesium     Status: Abnormal   Collection Time: 07/26/23  1:52 AM  Result Value Ref Range   Magnesium 1.6 (L) 1.7 - 2.4 mg/dL    Comment: Performed at [facility], [address] [place], [place] [address]  Phosphorus     Status: Abnormal   Collection Time: 07/26/23  1:52 AM  Result Value Ref Range   Phosphorus <1.0 (LL) 2.5 - 4.6 mg/dL    Comment: REPEATED TO VERIFY CRITICAL RESULT CALLED TO, READ BACK BY AND VERIFIED WITH Wynona Luna RN @0244  07/26/23 SATRAINR Performed at [facility], [address] [place], [place] [address]   Cortisol-am, blood     Status: None    Collection Time: 07/26/23  1:52 AM  Result Value Ref Range   Cortisol - AM 15.3 6.7 - 22.6 ug/dL    Comment: Performed at [facility], [address], [place], [place] [address]  Salicylate level     Status: None   Collection Time: 07/26/23  1:52 AM  Result Value Ref Range   Salicylate Lvl 16.2 7.0 - 30.0 mg/dL    Comment: Performed at [facility], [address] [place], [place] [address]  I-STAT 7, (LYTES, BLD GAS, ICA, H+H)     Status: Abnormal   Collection Time: 07/26/23  3:37 AM  Result Value Ref Range   pH, Arterial 7.411 7.35 - 7.45   pCO2 arterial 48.9 (H) 32 - 48 mmHg   pO2, Arterial 51 (L) 83 - 108 mmHg   Bicarbonate 30.6 (H)  20.0 - 28.0 mmol/L   TCO2 32 22 - 32 mmol/L   O2 Saturation 82 %   Acid-Base Excess 6.0 (H) 0.0 - 2.0 mmol/L   Sodium 135 135 - 145 mmol/L   Potassium 2.6 (LL) 3.5 - 5.1 mmol/L   Calcium, Ion 1.05 (L) 1.15 - 1.40 mmol/L   HCT 33.0 (L) 36.0 - 46.0 %   Hemoglobin 11.2 (L) 12.0 - 15.0 g/dL   Patient temperature 956.2 F    Collection site RADIAL, ALLEN'S TEST ACCEPTABLE    Drawn by RT    Sample type ARTERIAL    Comment NOTIFIED PHYSICIAN   Glucose, capillary     Status: Abnormal   Collection Time: 07/26/23  3:53 AM  Result Value Ref Range   Glucose-Capillary 130 (H) 70 - 99 mg/dL    Comment: Glucose reference range applies only to samples taken after fasting for at least 8 hours.  Glucose, capillary     Status: Abnormal   Collection Time: 07/26/23  8:24 AM  Result Value Ref Range   Glucose-Capillary 132 (H) 70 - 99 mg/dL    Comment: Glucose reference range applies only to samples taken after fasting for at least 8 hours.  Culture, Respiratory w Gram Stain     Status: None (Preliminary result)   Collection Time: 07/26/23  8:31 AM   Specimen: Tracheal Aspirate; Respiratory  Result Value Ref Range   Specimen Description TRACHEAL ASPIRATE    Special Requests NONE    Gram Stain      ABUNDANT WBC PRESENT, PREDOMINANTLY PMN RARE GRAM  POSITIVE COCCI IN PAIRS RARE GRAM NEGATIVE RODS Performed at [facility], [address] [place], [place] [address]    Culture PENDING    Report Status PENDING   Salicylate level     Status: Abnormal   Collection Time: 07/26/23  8:33 AM  Result Value Ref Range   Salicylate Lvl <7.0 (L) 7.0 - 30.0 mg/dL    Comment: Performed at [facility], [address] [place], [place] [address]  I-STAT 7, (LYTES, BLD GAS, ICA, H+H)     Status: Abnormal   Collection Time: 07/26/23  9:23 AM  Result Value Ref Range   pH, Arterial 7.366 7.35 - 7.45   pCO2 arterial 56.9 (H) 32 - 48 mmHg   pO2, Arterial 70 (L) 83 - 108 mmHg   Bicarbonate 32.2 (H) 20.0 - 28.0 mmol/L   TCO2 34 (H) 22 - 32 mmol/L   O2 Saturation 91 %   Acid-Base Excess 6.0 (H) 0.0 - 2.0 mmol/L   Sodium 132 (L) 135 - 145 mmol/L   Potassium 3.5 3.5 - 5.1 mmol/L   Calcium, Ion 1.09 (L) 1.15 - 1.40 mmol/L   HCT 33.0 (L) 36.0 - 46.0 %   Hemoglobin 11.2 (L) 12.0 - 15.0 g/dL   Patient temperature 96.2 C    Collection site RADIAL, ALLEN'S TEST ACCEPTABLE    Drawn by RT    Sample type ARTERIAL   Glucose, capillary     Status: Abnormal   Collection Time: 07/26/23 11:52 AM  Result Value Ref Range   Glucose-Capillary 117 (H) 70 - 99 mg/dL    Comment: Glucose reference range applies only to samples taken after fasting for at least 8 hours.  Basic metabolic panel     Status: Abnormal   Collection Time: 07/26/23 12:51 PM  Result Value Ref Range   Sodium 134 (L) 135 - 145 mmol/L   Potassium 3.3 (L) 3.5 -  5.1 mmol/L   Chloride 100 98 - 111 mmol/L   CO2 28 22 - 32 mmol/L   Glucose, Bld 117 (H) 70 - 99 mg/dL    Comment: Glucose reference range applies only to samples taken after fasting for at least 8 hours.   BUN <5 (L) 6 - 20 mg/dL   Creatinine, Ser 7.25 0.44 - 1.00 mg/dL   Calcium 7.0 (L) 8.9 - 10.3 mg/dL   GFR, Estimated >36 >64 mL/min    Comment: (NOTE) Calculated using the CKD-EPI Creatinine Equation (2021)    Anion  gap 6 5 - 15    Comment: Performed at [facility], [address] [place], [place] [address]  Magnesium     Status: Abnormal   Collection Time: 07/26/23 12:51 PM  Result Value Ref Range   Magnesium 2.5 (H) 1.7 - 2.4 mg/dL    Comment: Performed at [facility], [address], [place], [place] [address]  Phosphorus     Status: Abnormal   Collection Time: 07/26/23 12:51 PM  Result Value Ref Range   Phosphorus 2.2 (L) 2.5 - 4.6 mg/dL    Comment: Performed at [facility], [address], [place], [place] [address]  Urinalysis, Routine w reflex microscopic -Urine, Catheterized     Status: Abnormal   Collection Time: 07/26/23 12:51 PM  Result Value Ref Range   Color, Urine YELLOW YELLOW   APPearance HAZY (A) CLEAR   Specific Gravity, Urine 1.005 1.005 - 1.030   pH 8.0 5.0 - 8.0   Glucose, UA NEGATIVE NEGATIVE mg/dL   Hgb urine dipstick SMALL (A) NEGATIVE   Bilirubin Urine NEGATIVE NEGATIVE   Ketones, ur NEGATIVE NEGATIVE mg/dL   Protein, ur NEGATIVE NEGATIVE mg/dL   Nitrite NEGATIVE NEGATIVE   Leukocytes,Ua NEGATIVE NEGATIVE   RBC / HPF 0-5 0 - 5 RBC/hpf   WBC, UA 0-5 0 - 5 WBC/hpf   Bacteria, UA RARE (A) NONE SEEN   Squamous Epithelial / HPF 0-5 0 - 5 /HPF   Mucus PRESENT     Comment: Performed at [facility], [address], [place], [place] [address]  Acetaminophen level     Status: Abnormal   Collection Time: 07/26/23  9:26 PM  Result Value Ref Range   Acetaminophen (Tylenol), Serum <10 (L) 10 - 30 ug/mL    Comment: (NOTE) Therapeutic concentrations vary significantly. A range of 10-30 ug/mL  may be an effective concentration for many patients. However, some  are best treated at concentrations outside of this range. Acetaminophen concentrations >150 ug/mL at 4 hours after ingestion  and >50 ug/mL at 12 hours after ingestion are often associated with  toxic reactions.  Performed at [facility], [address], [place], [place] [address]    Salicylate level     Status: Abnormal   Collection Time: 07/26/23  9:26 PM  Result Value Ref Range   Salicylate Lvl <7.0 (L) 7.0 - 30.0 mg/dL    Comment: Performed at [facility], [address] [place], [place] [address]  Basic metabolic panel     Status: Abnormal   Collection Time: 07/26/23  9:26 PM  Result Value Ref Range   Sodium 132 (L) 135 - 145 mmol/L   Potassium 3.4 (L) 3.5 - 5.1 mmol/L   Chloride 97 (L) 98 - 111 mmol/L   CO2 28 22 - 32 mmol/L   Glucose, Bld 124 (H) 70 - 99 mg/dL  Comment: Glucose reference range applies only to samples taken after fasting for at least 8 hours.   BUN <5 (L) 6 - 20 mg/dL   Creatinine, Ser 4.09 (L) 0.44 - 1.00 mg/dL   Calcium 7.4 (L) 8.9 - 10.3 mg/dL   GFR, Estimated >81 >19 mL/min    Comment: (NOTE) Calculated using the CKD-EPI Creatinine Equation (2021)    Anion gap 7 5 - 15    Comment: Performed at [facility], [address] [place], [place] [address]  Magnesium     Status: None   Collection Time: 07/26/23  9:26 PM  Result Value Ref Range   Magnesium 1.8 1.7 - 2.4 mg/dL    Comment: Performed at [facility], [address], [place], [place] [address]  Phosphorus     Status: Abnormal   Collection Time: 07/26/23  9:26 PM  Result Value Ref Range   Phosphorus 2.0 (L) 2.5 - 4.6 mg/dL    Comment: Performed at [facility], [address], [place], [place] [address]  Basic metabolic panel     Status: Abnormal   Collection Time: 07/27/23  4:37 AM  Result Value Ref Range   Sodium 135 135 - 145 mmol/L   Potassium 3.8 3.5 - 5.1 mmol/L   Chloride 101 98 - 111 mmol/L   CO2 25 22 - 32 mmol/L   Glucose, Bld 108 (H) 70 - 99 mg/dL    Comment: Glucose reference range applies only to samples taken after fasting for at least 8 hours.   BUN <5 (L) 6 - 20 mg/dL   Creatinine, Ser 7.84 0.44 - 1.00 mg/dL   Calcium 7.8 (L) 8.9 - 10.3 mg/dL   GFR, Estimated >69 >62 mL/min    Comment: (NOTE) Calculated using the CKD-EPI  Creatinine Equation (2021)    Anion gap 9 5 - 15    Comment: Performed at [facility], [address], [place], [place] [address]  Phosphorus     Status: Abnormal   Collection Time: 07/27/23  4:37 AM  Result Value Ref Range   Phosphorus 2.3 (L) 2.5 - 4.6 mg/dL    Comment: Performed at [facility], [address] [place], [place] [address]  Magnesium     Status: None   Collection Time: 07/27/23  4:37 AM  Result Value Ref Range   Magnesium 2.1 1.7 - 2.4 mg/dL    Comment: Performed at [facility], [address] [place], [place] [address]  CBC     Status: Abnormal   Collection Time: 07/27/23  4:37 AM  Result Value Ref Range   WBC 10.6 (H) 4.0 - 10.5 K/uL   RBC 3.77 (L) 3.87 - 5.11 MIL/uL   Hemoglobin 11.5 (L) 12.0 - 15.0 g/dL   HCT 25.3 (L) 66.4 - 40.3 %   MCV 93.9 80.0 - 100.0 fL   MCH 30.5 26.0 - 34.0 pg   MCHC 32.5 30.0 - 36.0 g/dL   RDW 47.4 25.9 - 56.3 %   Platelets 131 (L) 150 - 400 K/uL    Comment: REPEATED TO VERIFY   nRBC 0.0 0.0 - 0.2 %    Comment: Performed at [facility], [address], [place], [place] [address]  Glucose, capillary     Status: None   Collection Time: 07/27/23  7:37 AM  Result Value Ref Range   Glucose-Capillary 99 70 - 99 mg/dL    Comment: Glucose reference range applies  only to samples taken after fasting for at least 8 hours.   EEG adult Result Date: 07/26/2023 Charlsie Quest, MD     07/26/2023  2:55 PM Patient Name: Linda Deleon MRN: [id number] Epilepsy Attending: Charlsie Quest Referring Physician/Provider: Talitha Givens, NP Date: 07/26/2023 Duration: 23.30 mins Patient history: 43yo F with ams. EEG to evaluate for seizure Level of alertness: Awake/ lethargic AEDs during EEG study: None Technical aspects: This EEG study was done with scalp electrodes positioned according to the 10-20 International system of electrode placement. Electrical activity was reviewed with band pass filter of 1-70Hz , sensitivity of 7  uV/mm, display speed of 29mm/sec with a 60Hz  notched filter applied as appropriate. EEG data were recorded continuously and digitally stored.  Video monitoring was available and reviewed as appropriate. Description: No posterior dominant rhythm was seen. EEG showed continuous generalized high amplitude 3 to 6 Hz theta-delta slowing with triphasic morphology. Hyperventilation and photic stimulation were not performed.   ABNORMALITY - Continuous slow, generalized IMPRESSION: This study is suggestive of moderate diffuse encephalopathy likely related to toxic-metabolic causes.  No seizures or epileptiform discharges were seen throughout the recording. Charlsie Quest   DG CHEST PORT 1 VIEW Result Date: 07/26/2023 CLINICAL DATA:  Hypoxia. EXAM: PORTABLE CHEST 1 VIEW COMPARISON:  Radiograph and CT yesterday FINDINGS: Stable positioning of right central line. Increasing heterogeneous bilateral lung opacities, particular increase in the left mid and lower lung zone. The heart is normal in size. No pleural effusion or pneumothorax. IMPRESSION: Increasing heterogeneous bilateral lung opacities, particular increase in the left mid and lower lung zone. Differential considerations include edema or infection. Electronically Signed   By: Narda Rutherford M.D.   On: 07/26/2023 11:38   DG CHEST PORT 1 VIEW Result Date: 07/25/2023 CLINICAL DATA:  [id number] Central venous catheter in place [id number] EXAM: PORTABLE CHEST - 1 VIEW COMPARISON:  Earlier film of the same day FINDINGS: Worsening patchy mid lung airspace opacities right greater than left. Progressive peripheral consolidation in the left upper lobe. Heart size and mediastinal contours are within normal limits. Right IJ central line placement to the distal SVC. No pneumothorax. No effusion. Visualized bones unremarkable. IMPRESSION: 1. Right IJ central line placement to the distal SVC. No pneumothorax. 2. Worsening bilateral airspace disease. Electronically Signed   By: Corlis Leak M.D.   On: 07/25/2023 19:11   CT Angio Chest PE W and/or Wo Contrast Result Date: 07/25/2023 CLINICAL DATA:  Pulmonary embolism (PE) suspected, high prob Cough with shortness of breath for 2 days.  Altered mental status. EXAM: CT ANGIOGRAPHY CHEST WITH CONTRAST TECHNIQUE: Multidetector CT imaging of the chest was performed using the standard protocol during bolus administration of intravenous contrast. Multiplanar CT image reconstructions and MIPs were obtained to evaluate the vascular anatomy. RADIATION DOSE REDUCTION: This exam was performed according to the departmental dose-optimization program which includes automated exposure control, adjustment of the mA and/or kV according to patient size and/or use of iterative reconstruction technique. CONTRAST:  75mL OMNIPAQUE IOHEXOL 350 MG/ML SOLN COMPARISON:  Radiographs 07/25/2023 and 07/02/2006. FINDINGS: Technical note: Despite efforts by the technologist and patient, mild motion artifact is present on today's exam and could not be eliminated. This reduces exam sensitivity and specificity. Cardiovascular: The pulmonary arteries are adequately opacified with contrast to the level of the segmental branches. There is no evidence of acute pulmonary embolism. No acute systemic arterial abnormalities are identified. There is mild reflux of contrast into the hepatic veins. The heart size is  normal. There is no pericardial effusion. Mediastinum/Nodes: There are no enlarged mediastinal, hilar or axillary lymph nodes. The thyroid gland, trachea and esophagus demonstrate no significant findings. Lungs/Pleura: No pleural effusion or pneumothorax. There are patchy ground-glass opacities anteriorly in both upper lobes which may be secondary to atypical infection or asymmetric edema. There is also mild involvement peripherally in the left lower lobe. No consolidation or suspicious pulmonary nodularity. Upper abdomen: No acute or significant findings are seen in the  visualized upper abdomen. Probable 1.5 cm cyst in the upper pole of the left kidney for which no specific follow-up imaging is recommended. Musculoskeletal/Chest wall: There is no chest wall mass or suspicious osseous finding. Review of the MIP images confirms the above findings. IMPRESSION: 1. No evidence of acute pulmonary embolism or other acute vascular findings in the chest. 2. Patchy ground-glass opacities anteriorly in both upper lobes and peripherally in the left lower lobe, possibly secondary to atypical infection or asymmetric edema. No consolidation or pleural effusion. Electronically Signed   By: Carey Bullocks M.D.   On: 07/25/2023 12:20   CT HEAD WO CONTRAST ( ) Result Date: 07/25/2023 CLINICAL DATA:  Neurologic deficit. EXAM: CT HEAD WITHOUT CONTRAST TECHNIQUE: Contiguous axial images were obtained from the base of the skull through the vertex without intravenous contrast. RADIATION DOSE REDUCTION: This exam was performed according to the departmental dose-optimization program which includes automated exposure control, adjustment of the mA and/or kV according to patient size and/or use of iterative reconstruction technique. COMPARISON:  None Available. FINDINGS: Brain: The ventricles and sulci appropriate size for patient's age. Small area of low attenuation involving the medial left cerebellar hemisphere suspicious for a small old infarct and encephalomalacia. There is no acute intracranial hemorrhage. No mass effect or midline shift. No extra-axial fluid collection. Vascular: No hyperdense vessel or unexpected calcification. Skull: Normal. Negative for fracture or focal lesion. Sinuses/Orbits: No acute finding. Other: None IMPRESSION: 1. No acute intracranial pathology. 2. 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 Electronically Signed   By: Elgie Collard M.D.   On: 07/25/2023 12:16   DG Chest Portable 1 View Result Date: 07/25/2023 CLINICAL  DATA:  Cough and shortness of breath for the past 2 days. EXAM: PORTABLE CHEST 1 VIEW COMPARISON:  Chest x-ray dated July 02, 2006. FINDINGS: The heart size and mediastinal contours are within normal limits. Normal pulmonary vascularity. Patchy opacities in the right mid lung, likely in the upper lobe. No pleural effusion or pneumothorax. No acute osseous abnormality. IMPRESSION: 1. Right upper lobe pneumonia. Electronically Signed   By: Obie Dredge M.D.   On: 07/25/2023 09:47    PMH:   Past Medical History:  Diagnosis Date   ADHD    Hypertension    Kidney stone     PSH:  History reviewed. No pertinent surgical history.  Allergies:  Allergies  Allergen Reactions   Sulfamethoxazole-Trimethoprim Hives and Itching    Medications:   Prior to Admission medications   Medication Sig Start Date End Date Taking? Authorizing Provider  cetirizine (ZYRTEC) 10 MG tablet Take 10 mg by mouth daily as needed for allergies.   Yes [provider]  magic mouthwash (nystatin, lidocaine, diphenhydrAMINE, alum & mag hydroxide) suspension Swish and swallow 5 mLs 4 (four) times daily as needed for mouth pain. 12/06/22  Yes Valinda Hoar, NP  metoprolol tartrate (LOPRESSOR) 50 MG tablet Take 1 tablet by mouth twice daily 02/24/23  Yes Rema Fendt, NP  Discontinued Meds:   Medications Discontinued During This Encounter  Medication Reason   ziprasidone (GEODON) injection 20 mg    LORazepam (ATIVAN) injection 1 mg    ziprasidone (GEODON) injection 20 mg    LORazepam (ATIVAN) injection 2 mg Completed Course   heparin injection 1,000 Units    heparin lock flush 100 unit/mL    cefTRIAXone (ROCEPHIN) 2 g in sodium chloride 0.9 % 100 mL IVPB    sodium bicarbonate 150 mEq in dextrose 5 % 1,150 mL infusion    enoxaparin (LOVENOX) injection 40 mg    dextrose 10 % infusion     Social History:  reports that she has been smoking cigarettes. She started smoking about 2 years ago. She has  a 1.1 pack-year smoking history. She has never used smokeless tobacco. She reports that she does not currently use alcohol. She reports that she does not currently use drugs.  Family History:  History reviewed. No pertinent family history.  Blood pressure 97/70, pulse (!) 108, temperature (!) 97.2 F (36.2 C), resp. rate 16, height 5\' 4"  (1.626 m), weight 61.5 kg, last menstrual period 07/22/2023, SpO2 97%. General: NAD, lying in hospital bed, mildly less ill appearing Neuro: Moves all for extremities, responds to tactile stimuli, does not open eyes, PERRL Cardiovascular: RRR, no murmurs Respiratory: mildly increased work of breathing on 7L N (improved from previous), no wheezes, rhonchi or rales Abdomen: soft, NTTP, no rebound or guarding Extremities: No peripheral edema     Celine Mans, MD, PGY-2 [facility] 8:25 AM 07/27/2023

## 2023-07-27 NOTE — Plan of Care (Signed)
  Problem: Safety: Goal: Non-violent Restraint(s) Outcome: Progressing   Problem: Education: Goal: Knowledge of General Education information will improve Description: Including pain rating scale, medication(s)/side effects and non-pharmacologic comfort measures Outcome: Progressing   Problem: Clinical Measurements: Goal: Ability to maintain clinical measurements within normal limits will improve Outcome: Progressing Goal: Respiratory complications will improve Outcome: Progressing Goal: Cardiovascular complication will be avoided Outcome: Progressing   Problem: Health Behavior/Discharge Planning: Goal: Ability to manage health-related needs will improve Outcome: Not Progressing   Problem: Clinical Measurements: Goal: Will remain free from infection Outcome: Not Progressing   Problem: Activity: Goal: Risk for activity intolerance will decrease Outcome: Not Progressing

## 2023-07-28 DIAGNOSIS — T39094A Poisoning by salicylates, undetermined, initial encounter: Secondary | ICD-10-CM | POA: Diagnosis not present

## 2023-07-28 DIAGNOSIS — J189 Pneumonia, unspecified organism: Secondary | ICD-10-CM | POA: Diagnosis not present

## 2023-07-28 DIAGNOSIS — G9340 Encephalopathy, unspecified: Secondary | ICD-10-CM | POA: Diagnosis not present

## 2023-07-28 DIAGNOSIS — R4182 Altered mental status, unspecified: Secondary | ICD-10-CM

## 2023-07-28 DIAGNOSIS — J9601 Acute respiratory failure with hypoxia: Secondary | ICD-10-CM | POA: Diagnosis not present

## 2023-07-28 LAB — GLUCOSE, CAPILLARY
Glucose-Capillary: 96 mg/dL (ref 70–99)
Glucose-Capillary: 92 mg/dL (ref 70–99)

## 2023-07-28 LAB — BASIC METABOLIC PANEL
Anion gap: 8 (ref 5–15)
BUN: 6 mg/dL (ref 6–20)
CO2: 19 mmol/L — ABNORMAL LOW (ref 22–32)
Calcium: 8.1 mg/dL — ABNORMAL LOW (ref 8.9–10.3)
Chloride: 109 mmol/L (ref 98–111)
Creatinine, Ser: 0.54 mg/dL (ref 0.44–1.00)
GFR, Estimated: >60 mL/min (ref >60)
Glucose, Bld: 98 mg/dL (ref 70–99)
Potassium: 3.9 mmol/L (ref 3.5–5.1)
Sodium: 136 mmol/L (ref 135–145)

## 2023-07-28 LAB — MAGNESIUM: Magnesium: 1.9 mg/dL (ref 1.7–2.4)

## 2023-07-28 LAB — PHOSPHORUS: Phosphorus: 3 mg/dL (ref 2.5–4.6)

## 2023-07-28 LAB — CULTURE, RESPIRATORY W GRAM STAIN: Culture: NORMAL

## 2023-07-28 MED ORDER — METOPROLOL TARTRATE 50 MG PO TABS: 50.0000 mg | ORAL_TABLET | Freq: Two times a day (BID) | ORAL | 0 refills | Status: DC

## 2023-07-28 MED ORDER — CLOTRIMAZOLE 10 MG MT TROC
10.0000 mg | Freq: Every day | OROMUCOSAL | Status: DC
  Filled 2023-07-28 (×3): qty 1

## 2023-07-28 MED ORDER — ORAL CARE MOUTH RINSE
15.0000 mL | OROMUCOSAL | Status: DC
  Administered 2023-07-28: 15 mL via OROMUCOSAL

## 2023-07-28 MED ORDER — CLOTRIMAZOLE 10 MG MT TROC: 10.0000 mg | Freq: Every day | OROMUCOSAL | 0 refills | Status: DC

## 2023-07-28 MED ORDER — ORAL CARE MOUTH RINSE: 15.0000 mL | OROMUCOSAL | Status: DC | PRN

## 2023-07-28 MED ORDER — AMOXICILLIN-POT CLAVULANATE 875-125 MG PO TABS: 1.0000 | ORAL_TABLET | Freq: Two times a day (BID) | ORAL | 0 refills | Status: DC

## 2023-07-28 NOTE — Discharge Summary (Addendum)
 Physician Discharge Summary       Patient ID: Linda Deleon MRN: [id number] DOB/AGE: [birth year] 43 y.o.  Admit date: 07/25/2023 Discharge date: 07/28/2023  Discharge Diagnoses:  Principal Problem:   Poisoning by salicylate, undetermined intent Active Problems:   Acute encephalopathy   Community acquired pneumonia   Acute respiratory failure with hypoxia (HCC)   History of Present Illness:  Linda Deleon is a 43 year old female with a history of hypertension, ADHD, who presented to the ED with altered mental status. Episode started about 4 days ago, was not acting like herself, with difficulty hearing, and acute confusion.  No new medicines.  Had some mild nausea, no vomiting.  She has had a dry cough, no URI symptoms, no episodes of aspiration. Symptoms progressively worsened on the weekend, prompting them to go to the [facility] on Monday.  Left due to long wait times.  She has had poor appetite denies diarrhea, seizures or urinary symptoms.  No psychiatric disease.Notably, she has a history of back pain and headaches and takes Goody powder for it.  She has been picking increased amount due to ongoing back pain and headaches. The ED, WBCs 13, VBG 7.38/23.6/14.1, elevated AG Ethanol and acetaminophen normal.  Salicylate 92. UDS + benzo(given in the ED.)  CT of chest with right upper lobe consolidation. BHB 0.89, UA with mild ketones, negative nitrites.  Hospital Course:  She was admitted to the ICU for salicylate toxicity and was started on bicarb infusion. Nephrology was consulted for hemodialysis with her first treatment 3/18. She was also treated for a community acquired pneumonia. After the initial treatment her salicylate level had markedly improved, but she remained encephalopathic, so her second HD tx was done 3/19. By 3/20 he mental status had greatly improved and she was transferred out of the ICU. By 3/21 she has returned to her baseline and is deemed a candidate for  discharge.    Discharge Plan by active problems   Severe salicylate toxicity associated with encephalopathy: by all available evidence and patient statement the overdose was accidental in nature and there are no desires for self-harm.  - S/p HD x 2 with marked improvement. ASA level undetectable (low) - Avoid aspirin and aspirin containing compounds.    CAP  - Has received 4 days of rocephin/azithromycin.  - Will prescribe and additional 1 day of augmentin at discharge for 5 day total course.     Hypertension - Resume home metoprolol. Instructed to check BP at home and hold is systolic blood pressure is less than .  Thrush - prescribe clotrimazole troche PRN    Significant Hospital tests/ studies    Consults  Nephrology  Discharge Exam: BP 123/79 (BP Location: Left Arm)   Pulse 80   Temp 98.3 F (36.8 C) (Oral)   Resp 18   Ht 5\' 4"  (1.626 m)   Wt 55 kg   LMP 07/22/2023 (Approximate)   SpO2 97%   BMI 20.81 kg/m   General:  middle aged female in NAD Neuro:  Alert, oriented, non-focal HEENT:  /AT, No JVD noted, PERRL Cardiovascular:  RRR, no MRG Lungs:  Clear bilateral breath sounds Abdomen:  Soft, non-distended, non-tender.  Musculoskeletal:  No acute deformity or ROM limitation. Ambulating around the unit.  Skin:  Intact, MMM   Labs at discharge Lab Results  Component Value Date   CREATININE 0.54 07/28/2023   BUN 6 07/28/2023   NA 136 07/28/2023   K 3.9 07/28/2023   CL 109 07/28/2023  CO2 19 (L) 07/28/2023   Lab Results  Component Value Date   WBC 10.6 (H) 07/27/2023   HGB 11.5 (L) 07/27/2023   HCT 35.4 (L) 07/27/2023   MCV 93.9 07/27/2023   PLT 131 (L) 07/27/2023   Lab Results  Component Value Date   ALT 22 07/25/2023   AST 32 07/25/2023   ALKPHOS 60 07/25/2023   BILITOT 0.7 07/25/2023   No results found for: "INR", "PROTIME"  Current radiology studies EEG adult Result Date: 07/26/2023 Charlsie Quest, MD     07/26/2023  2:55  PM Patient Name: Linda Deleon MRN: [id number] Epilepsy Attending: Charlsie Quest Referring Physician/Provider: Talitha Givens, NP Date: 07/26/2023 Duration: 23.30 mins Patient history: 43yo F with ams. EEG to evaluate for seizure Level of alertness: Awake/ lethargic AEDs during EEG study: None Technical aspects: This EEG study was done with scalp electrodes positioned according to the 10-20 International system of electrode placement. Electrical activity was reviewed with band pass filter of 1-70Hz , sensitivity of 7 uV/mm, display speed of 82mm/sec with a 60Hz  notched filter applied as appropriate. EEG data were recorded continuously and digitally stored.  Video monitoring was available and reviewed as appropriate. Description: No posterior dominant rhythm was seen. EEG showed continuous generalized high amplitude 3 to 6 Hz theta-delta slowing with triphasic morphology. Hyperventilation and photic stimulation were not performed.   ABNORMALITY - Continuous slow, generalized IMPRESSION: This study is suggestive of moderate diffuse encephalopathy likely related to toxic-metabolic causes.  No seizures or epileptiform discharges were seen throughout the recording. Linda Deleon    Disposition:  Discharge disposition: 01-Home or Self Care       Discharge Instructions     Call MD for:  difficulty breathing, headache or visual disturbances   Complete by: As directed    Call MD for:  extreme fatigue   Complete by: As directed    Call MD for:  hives   Complete by: As directed    Call MD for:  persistant nausea and vomiting   Complete by: As directed    Diet - low sodium heart healthy   Complete by: As directed    Increase activity slowly   Complete by: As directed       Allergies as of 07/28/2023       Reactions   Sulfamethoxazole-trimethoprim Hives, Itching        Medication List     TAKE these medications    amoxicillin-clavulanate 875-125 MG tablet Commonly known as:  AUGMENTIN Take 1 tablet by mouth 2 (two) times daily. Take first dose 3/22 morning Start taking on: July 29, 2023   cetirizine 10 MG tablet Commonly known as: ZYRTEC Take 10 mg by mouth daily as needed for allergies.   clotrimazole 10 MG troche Commonly known as: MYCELEX Take 1 tablet (10 mg total) by mouth 5 (five) times daily.   magic mouthwash (nystatin, lidocaine, diphenhydrAMINE, alum & mag hydroxide) suspension Swish and swallow 5 mLs 4 (four) times daily as needed for mouth pain.   metoprolol tartrate 50 MG tablet Commonly known as: LOPRESSOR Take 1 tablet (50 mg total) by mouth 2 (two) times daily. Do not take if systolic blood pressure (top number) is less than What changed: additional instructions         Discharged Condition: good  38 minutes of time have been dedicated to discharge assessment, planning and discharge instructions.   Signed:  Joneen Roach, AGACNP-BC [facility]  See Amion  for personal pager PCCM on call pager [phone] until 7pm. Please call Elink 7p-7a. [phone]  07/28/2023 12:32 PM

## 2023-07-28 NOTE — Evaluation (Signed)
 Physical Therapy Brief Evaluation and Discharge Note Patient Details Name: Linda Deleon MRN: [id number] DOB: 05-Dec-1980 Today's Date: 07/28/2023   History of Present Illness  Linda Deleon is a 43 year old female admitted 3/21 after presenting to the ED with altered mental status. Episode started about 4 days ago, was not acting like herself, with difficulty hearing, and acute confusion.   Had some mild nausea, no vomiting. Notably, she has a history of back pain and headaches and takes Goody powder for it.  She has been taking increased amount due to ongoing back pain and headaches. Noted to have salicylic acid unintentional OD.     UDS + benzo(given in the ED.) PMH: hypertension, ADHD  Clinical Impression  Pt admitted with above diagnosis.  Pt currently without functional limitations without LOB and no need for A device.  Pt with good balance and scored 24/24 on DGI. Plans to go home today.    PT Assessment Patient does not need any further PT services  Assistance Needed at Discharge  None    Equipment Recommendations None recommended by PT  Recommendations for Other Services       Precautions/Restrictions Precautions Precautions: None Restrictions Weight Bearing Restrictions Per Provider Order: No        Mobility  Bed Mobility Rolling: Independent        Transfers Overall transfer level: Independent                      Ambulation/Gait Ambulation/Gait assistance: Independent Gait Distance (Feet): 500 Feet Assistive device: None Gait Pattern/deviations: WFL(Within Functional Limits) Gait Speed: Pace WFL    Home Activity Instructions    Stairs            Modified Rankin (Stroke Patients Only)        Balance                 Standardized Balance Assessment Standardized Balance Assessment : Dynamic Gait Index Dynamic Gait Index Level Surface: Normal Change in Gait Speed: Normal Gait with Horizontal Head Turns: Normal Gait with  Vertical Head Turns: Normal Gait and Pivot Turn: Normal Step Over Obstacle: Normal Step Around Obstacles: Normal Steps: Normal Total Score: 24      Pertinent Vitals/Pain PT - Brief Vital Signs All Vital Signs Stable: Yes Pain Assessment Pain Assessment: No/denies pain     Home Living Family/patient expects to be discharged to:: Private residence Living Arrangements: Parent Available Help at Discharge: Family;Available 24 hours/day Home Environment: Level entry   Home Equipment: None        Prior Function Level of Independence: Independent      UE/LE Assessment   UE ROM/Strength/Tone/Coordination: WFL    LE ROM/Strength/Tone/Coordination: [facility]      Communication   Communication Communication: No apparent difficulties     Cognition Overall Cognitive Status: Appears within functional limits for tasks assessed/performed       General Comments      Exercises     Assessment/Plan    PT Problem List         PT Visit Diagnosis Muscle weakness (generalized) (M62.81)    No Skilled PT All education completed;Patient at baseline level of functioning;Patient is independent with all acitivity/mobility   Co-evaluation                AMPAC 6 Clicks Help needed turning from your back to your side while in a flat bed without using bedrails?: None Help needed moving from lying on your back to sitting  on the side of a flat bed without using bedrails?: None Help needed moving to and from a bed to a chair (including a wheelchair)?: None Help needed standing up from a chair using your arms (e.g., wheelchair or bedside chair)?: None Help needed to walk in hospital room?: None Help needed climbing 3-5 steps with a railing? : None 6 Click Score: 24      End of Session Equipment Utilized During Treatment: Gait belt Activity Tolerance: Patient tolerated treatment well Patient left: in bed;with call bell/phone within reach;with bed alarm set;with family/visitor  present Nurse Communication: Mobility status PT Visit Diagnosis: Muscle weakness (generalized) (M62.81)     Time: 1203-1221 PT Time Calculation (min) (ACUTE ONLY): 18 min  Charges:   PT Evaluation $PT Eval Low Complexity: 1 Low      Sabine Tenenbaum M,PT Acute Rehab Services [phone]   Bevelyn Buckles  07/28/2023, 2:37 PM

## 2023-07-28 NOTE — TOC Transition Note (Addendum)
 Transition of Care [facility]) - Discharge Note   Patient Details  Name: Linda Deleon MRN: [id number] Date of Birth: 10/16/1980  [facility] CM/SW Contact:  Tom-Johnson, Hershal Coria, RN Phone Number: 07/28/2023, 1:06 PM   Clinical Narrative:     Patient is scheduled for discharge today.  Readmission Risk Assessment done. Hospital f/u, New patient establishment and discharge instructions on AVS. Friend, Viviann Spare to transport at discharge.  No further TOC needs noted.        Final next level of care: Home/Self Care Barriers to Discharge: Barriers Resolved   Patient Goals and CMS Choice Patient states their goals for this hospitalization and ongoing recovery are:: To return home CMS Medicare.gov Compare Post Acute Care list provided to:: Patient Choice offered to / list presented to : Patient      Discharge Placement                Patient to be transferred to facility by: Regan Lemming Name of family member notified: [facility] Additional resources added to the After Visit Summary for                  DME Arranged: N/A DME Agency: NA       HH Arranged: NA HH Agency: NA        Social Drivers of Health (SDOH) Interventions SDOH Screenings   Food Insecurity: Patient Unable To Answer (07/27/2023)  Housing: Patient Unable To Answer (07/27/2023)  Transportation Needs: Patient Unable To Answer (07/27/2023)  Utilities: Patient Unable To Answer (07/27/2023)  Depression (PHQ2-9): High Risk (08/30/2022)  Social Connections: Unknown (09/10/2021)   Received from [facility]  Tobacco Use: High Risk (07/25/2023)     Readmission Risk Interventions    07/26/2023    3:11 PM  Readmission Risk Prevention Plan  Post Dischage Appt Complete  Medication Screening Complete  Transportation Screening Complete

## 2023-07-28 NOTE — Progress Notes (Signed)
 Patient has been given discharge packet, verbalizes understanding of all instructions given along with establishing care with PCP was advised. IV removed and transported to discharge lounge in hospital.

## 2023-07-28 NOTE — Progress Notes (Signed)
 OT Cancellation Note  Patient Details Name: Linda Deleon MRN: [id number] DOB: 1980-06-17   Cancelled Treatment:    Reason Eval/Treat Not Completed: OT screened, no needs identified, will sign off Patient working with PT, with PT noting no acute OT needs, and is fully independent. OT will sign off at this time, please re-consult if further acute needs arise.   Pollyann Glen E. Dominika Losey, OTR/L Acute Rehabilitation Services [phone]   Cherlyn Cushing 07/28/2023, 1:00 PM

## 2023-07-28 NOTE — Progress Notes (Signed)
 Marland Kitchen

## 2023-07-30 LAB — CULTURE, BLOOD (ROUTINE X 2)
Culture: NO GROWTH
Culture: NO GROWTH
Special Requests: ADEQUATE

## 2023-07-31 ENCOUNTER — Telehealth: Payer: Self-pay

## 2023-07-31 NOTE — Transitions of Care (Post Inpatient/ED Visit) (Signed)
   07/31/2023  Name: Linda Deleon MRN: [id number] DOB: [birth year]  Today's TOC FU Call Status: Today's TOC FU Call Status:: Successful TOC FU Call Completed TOC FU Call Complete Date: 07/31/23 Patient's Name and Date of Birth confirmed.  Transition Care Management Follow-up Telephone Call Date of Discharge: 07/28/23 Discharge Facility: [facility]) Type of Discharge: Inpatient Admission Primary Inpatient Discharge Diagnosis:: altered mental status. How have you been since you were released from the hospital?: Better Any questions or concerns?: No  Items Reviewed: Did you receive and understand the discharge instructions provided?: Yes Medications obtained,verified, and reconciled?: Yes (Medications Reviewed) (She did not have any questions about the med regime, She has a home BP monitor and correctly stated the orders for lopressor.) Any new allergies since your discharge?: No Dietary orders reviewed?: Yes Type of Diet Ordered:: heart healthy, low sodium Do you have support at home?: Yes Name of Support/Comfort Primary Source: She stated she has help but did not specify who  Medications Reviewed Today: Medications Reviewed Today     Reviewed by Robyne Peers, RN (Case Manager) on 07/31/23 at 1013  Med List Status: <None>   Medication Order Taking? Sig Documenting Provider Last Dose Status Informant  amoxicillin-clavulanate (AUGMENTIN) 875-125 MG tablet [id number]  Take 1 tablet by mouth 2 (two) times daily. Take first dose 3/22 morning Duayne Cal, NP  Active   cetirizine (ZYRTEC) 10 MG tablet [id number] No Take 10 mg by mouth daily as needed for allergies. [provider] Past Week Active Child  clotrimazole (MYCELEX) 10 MG troche [id number]  Take 1 tablet (10 mg total) by mouth 5 (five) times daily. Duayne Cal, NP  Active   magic mouthwash (nystatin, lidocaine, diphenhydrAMINE, alum & mag hydroxide) suspension [id number] No Swish and swallow 5 mLs 4 (four) times  daily as needed for mouth pain. Valinda Hoar, NP Past Week Active Child  metoprolol tartrate (LOPRESSOR) 50 MG tablet [id number]  Take 1 tablet (50 mg total) by mouth 2 (two) times daily. Do not take if systolic blood pressure (top number) is less than Duayne Cal, NP  Active             Home Care and Equipment/Supplies: Were Home Health Services Ordered?: No Any new equipment or medical supplies ordered?: No  Functional Questionnaire: Do you need assistance with bathing/showering or dressing?: No Do you need assistance with meal preparation?: No Do you need assistance with eating?: No Do you have difficulty maintaining continence: No Do you need assistance with getting out of bed/getting out of a chair/moving?: No Do you have difficulty managing or taking your medications?: No  Follow up appointments reviewed: PCP Follow-up appointment confirmed?: Yes Date of PCP follow-up appointment?: 08/11/23 Follow-up Provider: Ricky Stabs, NP Specialist Hospital Follow-up appointment confirmed?: NA Do you need transportation to your follow-up appointment?: No Do you understand care options if your condition(s) worsen?: Yes-patient verbalized understanding    SIGNATURE Robyne Peers, RN

## 2023-08-08 ENCOUNTER — Encounter (HOSPITAL_BASED_OUTPATIENT_CLINIC_OR_DEPARTMENT_OTHER): Payer: Self-pay | Admitting: *Deleted

## 2023-08-08 ENCOUNTER — Other Ambulatory Visit: Payer: Self-pay

## 2023-08-08 ENCOUNTER — Ambulatory Visit (HOSPITAL_BASED_OUTPATIENT_CLINIC_OR_DEPARTMENT_OTHER)

## 2023-08-08 ENCOUNTER — Emergency Department (HOSPITAL_BASED_OUTPATIENT_CLINIC_OR_DEPARTMENT_OTHER)

## 2023-08-08 ENCOUNTER — Encounter: Payer: Self-pay | Admitting: Family

## 2023-08-08 ENCOUNTER — Telehealth

## 2023-08-08 ENCOUNTER — Emergency Department (HOSPITAL_BASED_OUTPATIENT_CLINIC_OR_DEPARTMENT_OTHER)
Admission: EM | Admit: 2023-08-08 | Discharge: 2023-08-08 | Disposition: A | Discharge status: Home / Self Care | Attending: Emergency Medicine | Admitting: Emergency Medicine

## 2023-08-08 DIAGNOSIS — Z79899 Other long term (current) drug therapy: Secondary | ICD-10-CM | POA: Insufficient documentation

## 2023-08-08 DIAGNOSIS — I10 Essential (primary) hypertension: Secondary | ICD-10-CM | POA: Diagnosis not present

## 2023-08-08 DIAGNOSIS — R2243 Localized swelling, mass and lump, lower limb, bilateral: Secondary | ICD-10-CM | POA: Insufficient documentation

## 2023-08-08 DIAGNOSIS — E876 Hypokalemia: Secondary | ICD-10-CM | POA: Diagnosis not present

## 2023-08-08 DIAGNOSIS — R600 Localized edema: Secondary | ICD-10-CM | POA: Diagnosis not present

## 2023-08-08 DIAGNOSIS — M7989 Other specified soft tissue disorders: Secondary | ICD-10-CM | POA: Diagnosis not present

## 2023-08-08 LAB — CBC
HCT: 42 % (ref 36.0–46.0)
Hemoglobin: 13.7 g/dL (ref 12.0–15.0)
MCH: 30.8 pg (ref 26.0–34.0)
MCHC: 32.6 g/dL (ref 30.0–36.0)
MCV: 94.4 fL (ref 80.0–100.0)
Platelets: 385 10*3/uL (ref 150–400)
RBC: 4.45 MIL/uL (ref 3.87–5.11)
RDW: 14 % (ref 11.5–15.5)
WBC: 7.2 10*3/uL (ref 4.0–10.5)
nRBC: 0 % (ref 0.0–0.2)

## 2023-08-08 LAB — BASIC METABOLIC PANEL WITH GFR
Anion gap: 10 (ref 5–15)
BUN: 6 mg/dL (ref 6–20)
CO2: 24 mmol/L (ref 22–32)
Calcium: 9 mg/dL (ref 8.9–10.3)
Chloride: 105 mmol/L (ref 98–111)
Creatinine, Ser: 0.75 mg/dL (ref 0.44–1.00)
GFR, Estimated: >60 mL/min (ref >60)
Glucose, Bld: 105 mg/dL — ABNORMAL HIGH (ref 70–99)
Potassium: 3.3 mmol/L — ABNORMAL LOW (ref 3.5–5.1)
Sodium: 139 mmol/L (ref 135–145)

## 2023-08-08 MED ORDER — POTASSIUM CHLORIDE CRYS ER 20 MEQ PO TBCR
40.0000 meq | EXTENDED_RELEASE_TABLET | Freq: Once | ORAL | Status: AC
  Administered 2023-08-08: 40 meq via ORAL
  Filled 2023-08-08: qty 2

## 2023-08-08 NOTE — Telephone Encounter (Signed)
 Report to Emergency Department/Urgent Care/call 911 for immediate medical evaluation. Follow-up with Primary Care.

## 2023-08-08 NOTE — Discharge Instructions (Addendum)
 Her ultrasound study is negative.  You do not have blood clot.  Would recommend elevating legs above your heart while laying down.  Wear compression stockings consistently every day throughout the day.  Try to watch diet and avoid foods high in sodium.  please follow-up with primary care to readdress symptoms if they continue even with treatment.  Return to ER with new or worsening symptoms.

## 2023-08-08 NOTE — ED Provider Notes (Signed)
 [place] [facility] Provider Note   CSN: [id number] Arrival date & time: 08/08/23  1401     History  Chief Complaint  Patient presents with   Leg Swelling    Linda Deleon is a 43 y.o. female with past medical history of ADHD, hypertension recent hospitalization for community-acquired pneumonia and ASA poisoning presenting with complaint of bilateral lower extremity swelling.  Patient reports that swelling is intermittent and that tends to get worse during the day.  Patient denies any associated discomfort, erythema, rash. Denies chest pain, notes of breath, abdominal pain nausea vomit diarrhea.  Patient was in the hospital over the past week but has has had some prolonged immobilization.  No recent surgery.  No history of current cancer treatment.  No history of DVT or PE.  She is not on blood thinner.  HPI     Home Medications Prior to Admission medications   Medication Sig Start Date End Date Taking? Authorizing Provider  amoxicillin-clavulanate (AUGMENTIN) 875-125 MG tablet Take 1 tablet by mouth 2 (two) times daily. Take first dose 3/22 morning 07/29/23   Duayne Cal, NP  cetirizine (ZYRTEC) 10 MG tablet Take 10 mg by mouth daily as needed for allergies.    [provider]  clotrimazole (MYCELEX) 10 MG troche Take 1 tablet (10 mg total) by mouth 5 (five) times daily. 07/28/23   Duayne Cal, NP  magic mouthwash (nystatin, lidocaine, diphenhydrAMINE, alum & mag hydroxide) suspension Swish and swallow 5 mLs 4 (four) times daily as needed for mouth pain. 12/06/22   Valinda Hoar, NP  metoprolol tartrate (LOPRESSOR) 50 MG tablet Take 1 tablet (50 mg total) by mouth 2 (two) times daily. Do not take if systolic blood pressure (top number) is less than 07/28/23   Duayne Cal, NP      Allergies    Sulfamethoxazole-trimethoprim    Review of Systems   Review of Systems  Cardiovascular:  Positive for leg swelling.     Physical Exam Updated Vital Signs BP (!) 128/92 (BP Location: Right Arm)   Pulse 65   Temp 98.6 F (37 C)   Resp 18   LMP 07/22/2023 (Approximate)   SpO2 99%  Physical Exam Vitals and nursing note reviewed.  Constitutional:      General: She is not in acute distress.    Appearance: She is not toxic-appearing.  HENT:     Head: Normocephalic and atraumatic.  Eyes:     General: No scleral icterus.    Conjunctiva/sclera: Conjunctivae normal.  Cardiovascular:     Rate and Rhythm: Normal rate and regular rhythm.     Pulses: Normal pulses.     Heart sounds: Normal heart sounds.  Pulmonary:     Effort: Pulmonary effort is normal. No respiratory distress.     Breath sounds: Normal breath sounds.  Abdominal:     General: Abdomen is flat. Bowel sounds are normal.     Palpations: Abdomen is soft.     Tenderness: There is no abdominal tenderness.  Musculoskeletal:     Right lower leg: No edema.     Left lower leg: No edema.     Comments: No obvious swelling on exam, no unilateral calf swelling or tenderness.  Strong dorsal pedal pulse equal bilaterally.  No rash on lower extremity.  Skin:    General: Skin is warm and dry.     Findings: No lesion.  Neurological:     General: No focal  deficit present.     Mental Status: She is alert and oriented to person, place, and time. Mental status is at baseline.     ED Results / Procedures / Treatments   Labs (all labs ordered are listed, but only abnormal results are displayed) Labs Reviewed  BASIC METABOLIC PANEL WITH GFR - Abnormal; Notable for the following components:      Result Value   Potassium 3.3 (*)    Glucose, Bld 105 (*)    All other components within normal limits  CBC    EKG None  Radiology US Venous Img Lower Bilateral Result Date: 08/08/2023 CLINICAL DATA:  Bilateral swelling EXAM: BILATERAL LOWER EXTREMITY VENOUS DOPPLER ULTRASOUND TECHNIQUE: Gray-scale sonography with compression, as well as color and duplex  ultrasound, were performed to evaluate the deep venous system(s) from the level of the common femoral vein through the popliteal and proximal calf veins. COMPARISON:  None Available. FINDINGS: VENOUS Normal compressibility of the common femoral, superficial femoral, and popliteal veins, as well as the visualized calf veins. Visualized portions of profunda femoral vein and great saphenous vein unremarkable. No filling defects to suggest DVT on grayscale or color Doppler imaging. Doppler waveforms show normal direction of venous flow, normal respiratory plasticity and response to augmentation. OTHER None. Limitations: none IMPRESSION: Negative. Electronically Signed   By: Corlis Leak M.D.   On: 08/08/2023 16:50    Procedures Procedures    Medications Ordered in ED Medications - No data to display  ED Course/ Medical Decision Making/ A&P                                 Medical Decision Making Amount and/or Complexity of Data Reviewed Labs: ordered.  Risk Prescription drug management.   This patient presents to the ED for concern of leg swelling, this involves an extensive number of treatment options, and is a complaint that carries with it a high risk of complications and morbidity.  The differential diagnosis includes VTE, dependent edema, cellulitis congestive heart failure medication side effect   Lab Tests:  I personally interpreted labs.  The pertinent results include:   BC without leukocytosis and no anemia.  Potassium 3.3 will supplement orally   Imaging Studies ordered:  I ordered imaging studies including DVT study  I independently visualized and interpreted imaging which showed negative  I agree with the radiologist interpretation   Cardiac Monitoring: / EKG:  The patient was maintained on a cardiac monitor.   Problem List / ED Course / Critical interventions / Medication management  Patient reporting to ER with bilateral lower extremity swelling that has been  intermittent for the past week.  On exam she is hemodynamically stable and well-appearing.  She denies any associated chest pain or shortness of breath.  She has no pitting edema.  She is strong dorsal pedal pulse equal bilaterally.  No obvious rash.  She does have history of recent immobilization thus will obtain DVT study to rule this out.  Will check basic labs as well.  She has no recent medication changes.  Will supplement potassium orally and reassess. Given potassium orally.  Continues to be hemodynamically stable and well-appearing.  She is ambulating with steady gait.  Her DVT study is negative.  Suspect given worsening symptoms throughout prolonged standing or throughout the day this is related to dependent edema.  Feel symptomatic management with compression stockings and elevation as well as diet changes would  be appropriate for her.  She will follow-up with primary care.  Stable for discharge. I ordered medication including Potassium  Reevaluation of the patient after these medicines showed that the patient stayed the same I have reviewed the patients home medicines and have made adjustments as needed   Plan  F/u w/ PCP in 2-3d to ensure resolution of sx.  Patient was given return precautions. Patient stable for discharge at this time.  Patient educated on sx/dx and verbalized understanding of plan. Return to ER w/ new or worsening sx.          Final Clinical Impression(s) / ED Diagnoses Final diagnoses:  Leg edema    Rx / DC Orders ED Discharge Orders     None         Smitty Knudsen, PA-C 08/08/23 1722    Terald Sleeper, MD 08/08/23 1729

## 2023-08-08 NOTE — ED Triage Notes (Signed)
 Pt is here for bilateral leg swelling, right greater than left.  Pt states that she was recently hospitalized from Tuesday-Friday with an accidental aspirin overdose requiring her to have dialysis.  No sob with this.

## 2023-08-08 NOTE — ED Notes (Signed)
 Pt educated on all discharge information with no questions at this time. PIV removed.   Robina Ade, RN

## 2023-08-08 NOTE — Telephone Encounter (Signed)
 Left voicemail to call office back to go over this with her.

## 2023-08-11 ENCOUNTER — Ambulatory Visit: Admitting: Family

## 2023-08-11 VITALS — BP 133/88 | HR 72 | Temp 98.6°F | Ht 65.0 in | Wt 118.0 lb

## 2023-08-11 DIAGNOSIS — R600 Localized edema: Secondary | ICD-10-CM

## 2023-08-11 DIAGNOSIS — J189 Pneumonia, unspecified organism: Secondary | ICD-10-CM | POA: Diagnosis not present

## 2023-08-11 DIAGNOSIS — T39094D Poisoning by salicylates, undetermined, subsequent encounter: Secondary | ICD-10-CM

## 2023-08-11 DIAGNOSIS — F909 Attention-deficit hyperactivity disorder, unspecified type: Secondary | ICD-10-CM | POA: Diagnosis not present

## 2023-08-11 DIAGNOSIS — B370 Candidal stomatitis: Secondary | ICD-10-CM | POA: Diagnosis not present

## 2023-08-11 DIAGNOSIS — M542 Cervicalgia: Secondary | ICD-10-CM

## 2023-08-11 DIAGNOSIS — I10 Essential (primary) hypertension: Secondary | ICD-10-CM

## 2023-08-11 DIAGNOSIS — Z09 Encounter for follow-up examination after completed treatment for conditions other than malignant neoplasm: Secondary | ICD-10-CM

## 2023-08-11 DIAGNOSIS — G9340 Encephalopathy, unspecified: Secondary | ICD-10-CM | POA: Diagnosis not present

## 2023-08-11 NOTE — Progress Notes (Signed)
 Patient states she has adhd and doesn't have rx for her medication.

## 2023-08-11 NOTE — Progress Notes (Signed)
 Patient ID: Linda Deleon, female    DOB: Jun 09, 1980  MRN: [id number]  CC: Hospital Follow-Up  Subjective: Linda Deleon is a 43 y.o. female who presents for hospital follow-up.   Her concerns today include:  07/25/2023 - 07/28/2023 [facility] per MD note:    Discharge Plan by active problems    Severe salicylate toxicity associated with encephalopathy: by all available evidence and patient statement the overdose was accidental in nature and there are no desires for self-harm.  - S/p HD x 2 with marked improvement. ASA level undetectable (low) - Avoid aspirin and aspirin containing compounds.    CAP  - Has received 4 days of rocephin/azithromycin.  - Will prescribe and additional 1 day of augmentin at discharge for 5 day total course.     Hypertension - Resume home metoprolol. Instructed to check BP at home and hold is systolic blood pressure is less than .   Thrush - prescribe clotrimazole troche PRN  08/08/2023 [facility] Emergency Department at [facility] per MD note:  ED Course/ Medical Decision Making/ A&P                               Medical Decision Making Amount and/or Complexity of Data Reviewed Labs: ordered.   Risk Prescription drug management.     This patient presents to the ED for concern of leg swelling, this involves an extensive number of treatment options, and is a complaint that carries with it a high risk of complications and morbidity.  The differential diagnosis includes VTE, dependent edema, cellulitis congestive heart failure medication side effect     Lab Tests:   I personally interpreted labs.  The pertinent results include:   BC without leukocytosis and no anemia.  Potassium 3.3 will supplement orally     Imaging Studies ordered:   I ordered imaging studies including DVT study  I independently visualized and interpreted imaging which showed negative  I agree with the radiologist interpretation     Cardiac  Monitoring: / EKG:   The patient was maintained on a cardiac monitor.     Problem List / ED Course / Critical interventions / Medication management   Patient reporting to ER with bilateral lower extremity swelling that has been intermittent for the past week.  On exam she is hemodynamically stable and well-appearing.  She denies any associated chest pain or shortness of breath.  She has no pitting edema.  She is strong dorsal pedal pulse equal bilaterally.  No obvious rash.  She does have history of recent immobilization thus will obtain DVT study to rule this out.  Will check basic labs as well.  She has no recent medication changes.  Will supplement potassium orally and reassess. Given potassium orally.  Continues to be hemodynamically stable and well-appearing.  She is ambulating with steady gait.  Her DVT study is negative.  Suspect given worsening symptoms throughout prolonged standing or throughout the day this is related to dependent edema.  Feel symptomatic management with compression stockings and elevation as well as diet changes would be appropriate for her.  She will follow-up with primary care.  Stable for discharge. I ordered medication including Potassium  Reevaluation of the patient after these medicines showed that the patient stayed the same I have reviewed the patients home medicines and have made adjustments as needed     Plan   F/u w/ PCP in 2-3d to  ensure resolution of sx.  Patient was given return precautions. Patient stable for discharge at this time.  Patient educated on sx/dx and verbalized understanding of plan. Return to ER w/ new or worsening sx.   Today's office visit 08/11/2023: Reports feeling improved and stable since recent hospital discharge and Urgent Care visit, denies red flag symptoms. However, states left neck numbness since hospital discharge and states thinks related to where port was located, denies red flag symptoms. States bilateral lower extremities  edema no longer there. Doing well on medication regimen, no issues/concerns. States in the past she was taking medication for ADHD managed by a psychiatrist and needs new referral. No further issues/concerns for discussion today.   Patient Active Problem List   Diagnosis Date Noted   Altered mental status 07/28/2023   Acute respiratory failure with hypoxia (HCC) 07/27/2023   Poisoning by salicylate, undetermined intent 07/25/2023   Acute encephalopathy 07/25/2023   Community acquired pneumonia 07/25/2023     Current Outpatient Medications on File Prior to Visit  Medication Sig Dispense Refill   cetirizine (ZYRTEC) 10 MG tablet Take 10 mg by mouth daily as needed for allergies.     clotrimazole (MYCELEX) 10 MG troche Take 1 tablet (10 mg total) by mouth 5 (five) times daily. 35 Troche 0   metoprolol tartrate (LOPRESSOR) 50 MG tablet Take 1 tablet (50 mg total) by mouth 2 (two) times daily. Do not take if systolic blood pressure (top number) is less than 180 tablet 0   amoxicillin-clavulanate (AUGMENTIN) 875-125 MG tablet Take 1 tablet by mouth 2 (two) times daily. Take first dose 3/22 morning (Patient not taking: Reported on 08/11/2023) 2 tablet 0   magic mouthwash (nystatin, lidocaine, diphenhydrAMINE, alum & mag hydroxide) suspension Swish and swallow 5 mLs 4 (four) times daily as needed for mouth pain. (Patient not taking: Reported on 08/11/2023) 180 mL 0   No current facility-administered medications on file prior to visit.    Allergies  Allergen Reactions   Sulfamethoxazole-Trimethoprim Hives and Itching    Social History   Socioeconomic History   Marital status: Single    Spouse name: Not on file   Number of children: Not on file   Years of education: Not on file   Highest education level: Not on file  Occupational History   Not on file  Tobacco Use   Smoking status: Every Day    Current packs/day: 0.50    Average packs/day: 0.5 packs/day for 2.3 years (1.1 ttl  pk-yrs)    Types: Cigarettes    Start date: 05/09/2021   Smokeless tobacco: Never  Vaping Use   Vaping status: Never Used  Substance and Sexual Activity   Alcohol use: Not Currently   Drug use: Not Currently   Sexual activity: Yes    Birth control/protection: None  Other Topics Concern   Not on file  Social History Narrative   Not on file   Social Drivers of Health   Financial Resource Strain: Not on file  Food Insecurity: Patient Unable To Answer (07/27/2023)   Hunger Vital Sign    Worried About Running Out of Food in the Last Year: Patient unable to answer    Ran Out of Food in the Last Year: Patient unable to answer  Transportation Needs: Patient Unable To Answer (07/27/2023)   PRAPARE - Transportation    Lack of Transportation (Medical): Patient unable to answer    Lack of Transportation (Non-Medical): Patient unable to answer  Physical Activity: Not on  file  Stress: Not on file (03/16/2023)  Social Connections: Unknown (09/10/2021)   Received from [facility], [facility]   Social Network    Social Network: Not on file  Intimate Partner Violence: Patient Unable To Answer (07/27/2023)   Humiliation, Afraid, Rape, and Kick questionnaire    Fear of Current or Ex-Partner: Patient unable to answer    Emotionally Abused: Patient unable to answer    Physically Abused: Patient unable to answer    Sexually Abused: Patient unable to answer    No family history on file.  No past surgical history on file.  ROS: Review of Systems Negative except as stated above  PHYSICAL EXAM: BP 133/88   Pulse 72   Temp 98.6 F (37 C) (Oral)   Ht 5\' 5"  (1.651 m)   Wt 118 lb (53.5 kg)   LMP 07/22/2023 (Approximate)   SpO2 97%   BMI 19.64 kg/m   Physical Exam HENT:     Head: Normocephalic and atraumatic.     Nose: Nose normal.     Mouth/Throat:     Mouth: Mucous membranes are moist.     Pharynx: Oropharynx is clear.  Eyes:     Extraocular Movements: Extraocular movements  intact.     Conjunctiva/sclera: Conjunctivae normal.     Pupils: Pupils are equal, round, and reactive to light.  Cardiovascular:     Rate and Rhythm: Normal rate and regular rhythm.     Pulses: Normal pulses.     Heart sounds: Normal heart sounds.  Pulmonary:     Effort: Pulmonary effort is normal.     Breath sounds: Normal breath sounds.  Musculoskeletal:        General: Normal range of motion.     Right shoulder: Normal.     Left shoulder: Normal.     Right upper arm: Normal.     Left upper arm: Normal.     Right elbow: Normal.     Left elbow: Normal.     Right forearm: Normal.     Left forearm: Normal.     Right wrist: Normal.     Left wrist: Normal.     Right hand: Normal.     Left hand: Normal.     Cervical back: Normal, normal range of motion and neck supple.     Thoracic back: Normal.     Lumbar back: Normal.     Right hip: Normal.     Left hip: Normal.     Right upper leg: Normal.     Left upper leg: Normal.     Right knee: Normal.     Left knee: Normal.     Right lower leg: Normal.     Left lower leg: Normal.     Right ankle: Normal.     Left ankle: Normal.     Right foot: Normal.     Left foot: Normal.  Neurological:     General: No focal deficit present.     Mental Status: She is alert and oriented to person, place, and time.  Psychiatric:        Mood and Affect: Mood normal.        Behavior: Behavior normal.     ASSESSMENT AND PLAN: 1. Hospital discharge follow-up (Primary) - Reviewed hospital course, current medications, ensured proper follow-up in place, and addressed concerns.   2. Salicylate overdose, undetermined intent, subsequent encounter 3. Encephalopathy, unspecified type - Per hospital discharge note "by all available evidence and patient statement  the overdose was accidental in nature and there are no desires for self-harm." - Patient denies thoughts of self-harm, suicidal ideations, homicidal ideations.   4. Community acquired  pneumonia, unspecified laterality - Patient today in office with no cardiopulmonary/acute distress. - Diagnostic chest xray in 2 weeks or sooner if needed. - DG Chest 2 View; Future  5. Primary hypertension - Continue Metoprolol as prescribed. No refills needed as of present.  - Counseled on blood pressure goal of less than 130/80, low-sodium, DASH diet, medication compliance, and 150 minutes of moderate intensity exercise per week as tolerated. Counseled on medication adherence and adverse effects. - Follow-up with primary provider in 3 months or sooner if needed.  6. Thrush - Continue Clotrimazole troche as prescribed. Counseled on medication adherence/adverse effects. - Follow-up with primary provider as scheduled.  7. Bilateral leg edema - Resolved.  8. Attention deficit hyperactivity disorder (ADHD), unspecified ADHD type - Referral to Psychiatry for evaluation/management. - Ambulatory referral to Psychiatry  9. Neck pain - Referral to Orthopedic Surgery for evaluation/management. - Ambulatory referral to Orthopedic Surgery    Patient was given the opportunity to ask questions.  Patient verbalized understanding of the plan and was able to repeat key elements of the plan. Patient was given clear instructions to go to Emergency Department or return to medical center if symptoms don't improve, worsen, or new problems develop.The patient verbalized understanding.   Orders Placed This Encounter  Procedures   DG Chest 2 View   Ambulatory referral to Psychiatry   Ambulatory referral to Orthopedic Surgery   Follow-up with primary provider as scheduled.  Rema Fendt, NP

## 2023-09-11 ENCOUNTER — Ambulatory Visit (INDEPENDENT_AMBULATORY_CARE_PROVIDER_SITE_OTHER)

## 2023-09-11 ENCOUNTER — Encounter: Payer: Self-pay | Admitting: Family

## 2023-09-11 ENCOUNTER — Ambulatory Visit (INDEPENDENT_AMBULATORY_CARE_PROVIDER_SITE_OTHER): Admitting: Family

## 2023-09-11 VITALS — BP 120/82 | HR 66 | Temp 98.4°F | Resp 16 | Ht 64.0 in | Wt 116.8 lb

## 2023-09-11 DIAGNOSIS — J189 Pneumonia, unspecified organism: Secondary | ICD-10-CM

## 2023-09-11 DIAGNOSIS — R918 Other nonspecific abnormal finding of lung field: Secondary | ICD-10-CM | POA: Diagnosis not present

## 2023-09-11 NOTE — Progress Notes (Signed)
 Follow up on chest xray

## 2023-09-11 NOTE — Progress Notes (Signed)
 Patient ID: Linda Deleon, female    DOB: [birth year]  MRN: [id number]  CC: Pneumonia Follow-Up  Subjective: Linda Deleon is a 43 y.o. female who presents for pneumonia follow-up.   Her concerns today include:  Reports feeling improved. Denies red flag symptoms. No issues/concerns for discussion today.  Patient Active Problem List   Diagnosis Date Noted   Altered mental status 07/28/2023   Acute respiratory failure with hypoxia (HCC) 07/27/2023   Poisoning by salicylate, undetermined intent 07/25/2023   Acute encephalopathy 07/25/2023   Community acquired pneumonia 07/25/2023     Current Outpatient Medications on File Prior to Visit  Medication Sig Dispense Refill   cetirizine (ZYRTEC) 10 MG tablet Take 10 mg by mouth daily as needed for allergies.     amoxicillin -clavulanate (AUGMENTIN ) 875-125 MG tablet Take 1 tablet by mouth 2 (two) times daily. Take first dose 3/22 morning (Patient not taking: Reported on 08/11/2023) 2 tablet 0   clotrimazole  (MYCELEX ) 10 MG troche Take 1 tablet (10 mg total) by mouth 5 (five) times daily. (Patient not taking: Reported on 09/11/2023) 35 Troche 0   magic mouthwash (nystatin , lidocaine , diphenhydrAMINE , alum & mag hydroxide) suspension Swish and swallow 5 mLs 4 (four) times daily as needed for mouth pain. (Patient not taking: Reported on 08/11/2023) 180 mL 0   metoprolol  tartrate (LOPRESSOR ) 50 MG tablet Take 1 tablet (50 mg total) by mouth 2 (two) times daily. Do not take if systolic blood pressure (top number) is less than (Patient not taking: Reported on 09/11/2023) 180 tablet 0   No current facility-administered medications on file prior to visit.    Allergies  Allergen Reactions   Sulfamethoxazole-Trimethoprim Hives and Itching    Social History   Socioeconomic History   Marital status: Single    Spouse name: Not on file   Number of children: Not on file   Years of education: Not on file   Highest education level: Not on file   Occupational History   Not on file  Tobacco Use   Smoking status: Every Day    Current packs/day: 0.50    Average packs/day: 0.5 packs/day for 2.3 years (1.2 ttl pk-yrs)    Types: Cigarettes    Start date: 05/09/2021   Smokeless tobacco: Never  Vaping Use   Vaping status: Never Used  Substance and Sexual Activity   Alcohol use: Not Currently   Drug use: Not Currently   Sexual activity: Yes    Birth control/protection: None  Other Topics Concern   Not on file  Social History Narrative   Not on file   Social Drivers of Health   Financial Resource Strain: Low Risk  (09/11/2023)   Overall Financial Resource Strain (CARDIA)    Difficulty of Paying Living Expenses: Not hard at all  Food Insecurity: No Food Insecurity (09/11/2023)   Hunger Vital Sign    Worried About Running Out of Food in the Last Year: Never true    Ran Out of Food in the Last Year: Never true  Transportation Needs: No Transportation Needs (09/11/2023)   PRAPARE - Administrator, Civil Service (Medical): No    Lack of Transportation (Non-Medical): No  Physical Activity: Sufficiently Active (09/11/2023)   Exercise Vital Sign    Days of Exercise per Week: 7 days    Minutes of Exercise per Session: 30 min  Stress: No Stress Concern Present (09/11/2023)   [employer] of Occupational Health - Occupational Stress Questionnaire    Feeling  of Stress : Not at all  Social Connections: Socially Isolated (09/11/2023)   Social Connection and Isolation Panel [NHANES]    Frequency of Communication with Friends and Family: More than three times a week    Frequency of Social Gatherings with Friends and Family: More than three times a week    Attends Religious Services: Never    Database administrator or Organizations: No    Attends Banker Meetings: Never    Marital Status: Never married  Intimate Partner Violence: Not At Risk (09/11/2023)   Humiliation, Afraid, Rape, and Kick questionnaire    Fear of  Current or Ex-Partner: No    Emotionally Abused: No    Physically Abused: No    Sexually Abused: No    History reviewed. No pertinent family history.  History reviewed. No pertinent surgical history.  ROS: Review of Systems Negative except as stated above  PHYSICAL EXAM: BP 120/82   Pulse 66   Temp 98.4 F (36.9 C) (Oral)   Resp 16   Ht 5\' 4"  (1.626 m)   Wt 116 lb 12.8 oz (53 kg)   LMP 08/22/2023 (Approximate)   SpO2 95%   BMI 20.05 kg/m   Physical Exam HENT:     Head: Normocephalic and atraumatic.     Nose: Nose normal.     Mouth/Throat:     Mouth: Mucous membranes are moist.     Pharynx: Oropharynx is clear.  Eyes:     Extraocular Movements: Extraocular movements intact.     Conjunctiva/sclera: Conjunctivae normal.     Pupils: Pupils are equal, round, and reactive to light.  Cardiovascular:     Rate and Rhythm: Normal rate and regular rhythm.     Pulses: Normal pulses.     Heart sounds: Normal heart sounds.  Pulmonary:     Effort: Pulmonary effort is normal.     Breath sounds: Normal breath sounds.  Musculoskeletal:        General: Normal range of motion.     Cervical back: Normal range of motion and neck supple.  Neurological:     General: No focal deficit present.     Mental Status: She is alert and oriented to person, place, and time.  Psychiatric:        Mood and Affect: Mood normal.        Behavior: Behavior normal.     ASSESSMENT AND PLAN: 1. Community acquired pneumonia, unspecified laterality (Primary) - Patient today in office with no cardiopulmonary/acute distress.  - Diagnostic chest xray for pneumonia follow-up. - DG Chest 2 View; Future   Patient was given the opportunity to ask questions.  Patient verbalized understanding of the plan and was able to repeat key elements of the plan. Patient was given clear instructions to go to Emergency Department or return to medical center if symptoms don't improve, worsen, or new problems develop.The  patient verbalized understanding.   Orders Placed This Encounter  Procedures   DG Chest 2 View   Follow-up with primary provider as scheduled.   Senaida Dama, NP

## 2023-09-12 ENCOUNTER — Encounter: Payer: Self-pay | Admitting: Family

## 2023-10-20 ENCOUNTER — Other Ambulatory Visit: Payer: Self-pay | Admitting: Family

## 2023-10-20 DIAGNOSIS — I10 Essential (primary) hypertension: Secondary | ICD-10-CM | POA: Diagnosis not present

## 2023-10-20 NOTE — Telephone Encounter (Unsigned)
 Copied from CRM [id number]. Topic: Clinical - Medication Refill >> Oct 20, 2023  3:48 PM Alica Antu wrote: Medication:  metoprolol  tartrate (LOPRESSOR ) 50 MG tablet   Has the patient contacted their pharmacy? Yes (Agent: If no, request that the patient contact the pharmacy for the refill. If patient does not wish to contact the pharmacy document the reason why and proceed with request.) (Agent: If yes, when and what did the pharmacy advise?)  This is the patient's preferred pharmacy:    [facility] [address] ([address]), [place] - [address] [place] ([place]) [place] [address] Phone: [phone] Fax: [phone]     Is this the correct pharmacy for this prescription? Yes If no, delete pharmacy and type the correct one.   Has the prescription been filled recently? Yes  Is the patient out of the medication? Yes  Has the patient been seen for an appointment in the last year OR does the patient have an upcoming appointment? Yes  Can we respond through MyChart? Yes  Agent: Please be advised that Rx refills may take up to 3 business days. We ask that you follow-up with your pharmacy.

## 2023-10-23 NOTE — Telephone Encounter (Signed)
 Requested medications are due for refill today.  yes  Requested medications are on the active medications list.  yes  Last refill. 07/28/2023 #180 0 rf  Future visit scheduled.   no  Notes to clinic.  Rx signed by Roz Cornelia    Requested Prescriptions  Pending Prescriptions Disp Refills   metoprolol  tartrate (LOPRESSOR ) 50 MG tablet 180 tablet 0    Sig: Take 1 tablet (50 mg total) by mouth 2 (two) times daily. Do not take if systolic blood pressure (top number) is less than     Cardiovascular:  Beta Blockers Failed - 10/23/2023  4:37 PM      Failed - Valid encounter within last 6 months    Recent Outpatient Visits           1 month ago Community acquired pneumonia, unspecified laterality   [facility], [place], NP   2 months ago Hospital discharge follow-up   [facility], [place], NP   1 year ago Encounter to establish care   [facility] at [facility], Amy J, NP              Passed - Last BP in normal range    BP Readings from Last 1 Encounters:  09/11/23 120/82         Passed - Last Heart Rate in normal range    Pulse Readings from Last 1 Encounters:  09/11/23 66

## 2023-12-19 ENCOUNTER — Telehealth: Payer: Self-pay

## 2023-12-19 DIAGNOSIS — I10 Essential (primary) hypertension: Secondary | ICD-10-CM

## 2023-12-19 NOTE — Progress Notes (Unsigned)
 Complex Care Management Note Care Guide Note  12/19/2023 Name: Linda Deleon MRN: [id number] DOB: 1980/09/21   Complex Care Management Outreach Attempts: An unsuccessful telephone outreach was attempted today to offer the patient information about available complex care management services.  Follow Up Plan:  Additional outreach attempts will be made to offer the patient complex care management information and services.   Encounter Outcome:  No Answer  Dreama [facility]  [facility], [facility] Health Care Management Assistant Direct Dial: [phone]  Fax: [phone]

## 2023-12-22 ENCOUNTER — Other Ambulatory Visit: Payer: Self-pay | Admitting: Medical Genetics

## 2023-12-22 NOTE — Progress Notes (Unsigned)
 Complex Care Management Note Care Guide Note  12/22/2023 Name: Linda Deleon MRN: [id number] DOB: March 02, 1981   Complex Care Management Outreach Attempts: A second unsuccessful outreach was attempted today to offer the patient with information about available complex care management services.  Follow Up Plan:  Additional outreach attempts will be made to offer the patient complex care management information and services.   Encounter Outcome:  No Answer  Dreama [facility]  [facility], [facility] Health Care Management Assistant Direct Dial: [phone]  Fax: [phone]

## 2023-12-27 NOTE — Progress Notes (Signed)
 Complex Care Management Note Care Guide Note  12/27/2023 Name: Linda Deleon MRN: [id number] DOB: [birth year]   Complex Care Management Outreach Attempts: A third unsuccessful outreach was attempted today to offer the patient with information about available complex care management services.  Follow Up Plan:  No further outreach attempts will be made at this time. We have been unable to contact the patient to offer or enroll patient in complex care management services.  Encounter Outcome:  No Answer  Dreama [facility]  [facility], [facility] VBCI Assistant Direct Dial: [phone]  Fax: [phone]

## 2023-12-31 DIAGNOSIS — I10 Essential (primary) hypertension: Secondary | ICD-10-CM | POA: Diagnosis not present

## 2024-01-22 DIAGNOSIS — B009 Herpesviral infection, unspecified: Secondary | ICD-10-CM | POA: Diagnosis not present

## 2024-01-22 DIAGNOSIS — I10 Essential (primary) hypertension: Secondary | ICD-10-CM | POA: Diagnosis not present

## 2024-02-10 ENCOUNTER — Encounter: Payer: Self-pay | Admitting: Family

## 2024-02-12 ENCOUNTER — Other Ambulatory Visit: Payer: Self-pay | Admitting: Family Medicine

## 2024-02-12 DIAGNOSIS — I10 Essential (primary) hypertension: Secondary | ICD-10-CM

## 2024-02-12 MED ORDER — METOPROLOL TARTRATE 50 MG PO TABS: 50.0000 mg | ORAL_TABLET | Freq: Two times a day (BID) | ORAL | 0 refills | Status: DC

## 2024-02-14 ENCOUNTER — Telehealth: Admitting: Physician Assistant

## 2024-02-14 DIAGNOSIS — Z760 Encounter for issue of repeat prescription: Secondary | ICD-10-CM

## 2024-02-14 NOTE — Patient Instructions (Signed)
  [employer], thank you for joining Delon CHRISTELLA Dickinson, PA-C for today's virtual visit.  While this provider is not your primary care provider (PCP), if your PCP is located in our provider database this encounter information will be shared with them immediately following your visit.   A [facility] MyChart account gives you access to today's visit and all your visits, tests, and labs performed at [facility]  click here if you don't have a [facility] MyChart account or go to [email or web address]  Consent: (Patient) Linda Deleon provided verbal consent for this virtual visit at the beginning of the encounter.  Current Medications:  Current Outpatient Medications:    cetirizine (ZYRTEC) 10 MG tablet, Take 10 mg by mouth daily as needed for allergies., Disp: , Rfl:    metoprolol  tartrate (LOPRESSOR ) 50 MG tablet, Take 1 tablet (50 mg total) by mouth 2 (two) times daily. Do not take if systolic blood pressure (top number) is less than , Disp: 180 tablet, Rfl: 0   Medications ordered in this encounter:  No orders of the defined types were placed in this encounter.    *If you need refills on other medications prior to your next appointment, please contact your pharmacy*  Follow-Up: Call back or seek an in-person evaluation if the symptoms worsen or if the condition fails to improve as anticipated.  [facility] [phone]   If you have been instructed to have an in-person evaluation today at a local Urgent Care facility, please use the link below. It will take you to a list of all of our available [facility], including address, phone number and hours of operation. Please do not delay care.  [facility]  If you or a family member do not have a primary care provider, use the link below to schedule a visit and establish care. When you choose a [facility] primary care physician or advanced practice provider, you gain a  long-term partner in health. Find a Primary Care Provider  Learn more about [facility]'s in-office and virtual care options: [facility] - Get Care Now

## 2024-02-14 NOTE — Progress Notes (Signed)
 Was requesting a medication refill for Metoprolol . However, this was recently filled by her PCP on 02/12/24. Made patient aware. No other questions.   No Charge.

## 2024-02-17 ENCOUNTER — Other Ambulatory Visit: Payer: Self-pay | Admitting: Medical Genetics

## 2024-02-17 DIAGNOSIS — Z006 Encounter for examination for normal comparison and control in clinical research program: Secondary | ICD-10-CM

## 2024-02-26 ENCOUNTER — Telehealth: Payer: Self-pay

## 2024-02-26 DIAGNOSIS — I10 Essential (primary) hypertension: Secondary | ICD-10-CM

## 2024-02-26 NOTE — Progress Notes (Unsigned)
 Complex Care Management Note Care Guide Note  02/26/2024 Name: Linda Deleon MRN: [id number] DOB: 1981-03-03   Complex Care Management Outreach Attempts: An unsuccessful telephone outreach was attempted today to offer the patient information about available complex care management services.  Follow Up Plan:  Additional outreach attempts will be made to offer the patient complex care management information and services.   Encounter Outcome:  No Answer  Dreama [facility]  [facility], [facility] VBCI Assistant Direct Dial: [phone]  Fax: [phone]

## 2024-02-27 NOTE — Progress Notes (Unsigned)
 Complex Care Management Note Care Guide Note  02/27/2024 Name: Linda Deleon MRN: [id number] DOB: April 23, 1981   Complex Care Management Outreach Attempts: A second unsuccessful outreach was attempted today to offer the patient with information about available complex care management services.  Follow Up Plan:  Additional outreach attempts will be made to offer the patient complex care management information and services.   Encounter Outcome:  No Answer  Dreama [facility]  [facility], [facility] VBCI Assistant Direct Dial: [phone]  Fax: [phone]

## 2024-02-29 NOTE — Progress Notes (Signed)
 Complex Care Management Note  Care Guide Note 02/29/2024 Name: Sofiya Ezelle MRN: [id number] DOB: [birth year]  Makita Blow is a 43 y.o. year old female who sees Lorren Greig PARAS, NP for primary care. I reached out to [employer] by phone today to offer complex care management services.  Ms. Elbe was given information about Complex Care Management services today including:   The Complex Care Management services include support from the care team which includes your Nurse Care Manager, Clinical Social Worker, or Pharmacist.  The Complex Care Management team is here to help remove barriers to the health concerns and goals most important to you. Complex Care Management services are voluntary, and the patient may decline or stop services at any time by request to their care team member.   Complex Care Management Consent Status: Patient agreed to services and verbal consent obtained.   Follow up plan:  Telephone appointment with complex care management team member scheduled for:  03/07/24 at 1:00 p.m.   Encounter Outcome:  Patient Scheduled  Dreama [facility]  [facility], [facility] VBCI Assistant Direct Dial: [phone]  Fax: [phone]

## 2024-03-04 DIAGNOSIS — N390 Urinary tract infection, site not specified: Secondary | ICD-10-CM | POA: Diagnosis not present

## 2024-03-04 DIAGNOSIS — R10A1 Flank pain, right side: Secondary | ICD-10-CM | POA: Diagnosis not present

## 2024-03-04 DIAGNOSIS — Z87442 Personal history of urinary calculi: Secondary | ICD-10-CM | POA: Diagnosis not present

## 2024-03-06 ENCOUNTER — Ambulatory Visit (HOSPITAL_BASED_OUTPATIENT_CLINIC_OR_DEPARTMENT_OTHER): Admitting: Orthopaedic Surgery

## 2024-03-07 ENCOUNTER — Telehealth: Payer: Self-pay | Admitting: *Deleted

## 2024-03-07 ENCOUNTER — Encounter: Payer: Self-pay | Admitting: *Deleted

## 2024-03-07 NOTE — Patient Instructions (Signed)
 [facility] - I am sorry I was unable to reach you today for our scheduled appointment. I work with Lorren Greig PARAS, NP and am calling to support your healthcare needs. Please contact me at [phone] at your earliest convenience. I look forward to speaking with you soon.   Thank you,   Olam Ku, RN, BSN [place]  [facility], [facility] RN Care Manager Direct Dial: [phone]  Fax: [phone]

## 2024-03-11 ENCOUNTER — Encounter: Payer: Self-pay | Admitting: Radiology

## 2024-03-13 ENCOUNTER — Telehealth: Payer: Self-pay

## 2024-03-13 LAB — GENECONNECT MOLECULAR SCREEN: Genetic Analysis Overall Interpretation: NEGATIVE

## 2024-03-13 NOTE — Progress Notes (Unsigned)
 Complex Care Management Note Care Guide Note  03/13/2024 Name: Linda Deleon MRN: [id number] DOB: 03/20/1981   Complex Care Management Outreach Attempts: An unsuccessful telephone outreach was attempted today to offer the patient information about available complex care management services.  Follow Up Plan:  Additional outreach attempts will be made to offer the patient complex care management information and services.   Encounter Outcome:  No Answer  Dreama [facility]  [facility], [facility] VBCI Assistant Direct Dial: [phone]  Fax: [phone]

## 2024-03-15 NOTE — Progress Notes (Signed)
 Complex Care Management Note Care Guide Note  03/15/2024 Name: Linda Deleon MRN: [id number] DOB: 1980-12-23   Complex Care Management Outreach Attempts: A second unsuccessful outreach was attempted today to offer the patient with information about available complex care management services.  Follow Up Plan:  No further outreach attempts will be made at this time. We have been unable to contact the patient to offer or enroll patient in complex care management services.  Encounter Outcome:  No Answer  Dreama [facility]  [facility], [facility] VBCI Assistant Direct Dial: [phone]  Fax: [phone]

## 2024-03-28 ENCOUNTER — Telehealth: Admitting: Physician Assistant

## 2024-03-28 DIAGNOSIS — H02882 Meibomian gland dysfunction right lower eyelid: Secondary | ICD-10-CM | POA: Diagnosis not present

## 2024-03-28 NOTE — Progress Notes (Signed)
 We are sorry that you are not feeling well. Here is how we plan to help!  Based on what you have shared with me it looks like you have a stye.  A stye is an inflammation of the eyelid.  It is often a red, painful lump near the edge of the eyelid that may look like a boil or a pimple.  A stye develops when an infection occurs at the base of an eyelash.   We have made appropriate suggestions for you based upon your presentation: Simple styes can be treated without medical intervention.  Most styes either resolve spontaneously or resolve with simple home treatment by applying warm compresses or heated washcloth to the stye for about 10-15 minutes three to four times a day. This causes the stye to drain and resolve.  HOME CARE:  Wash your hands often! Let the stye open on its own. Don't squeeze or open it. Don't rub your eyes. This can irritate your eyes and let in bacteria.  If you need to touch your eyes, wash your hands first. Don't wear eye makeup or contact lenses until the area has healed.  GET HELP RIGHT AWAY IF:  Your symptoms do not improve. You develop blurred or loss of vision. Your symptoms worsen (increased discharge, pain or redness).  Thank you for choosing an e-visit.  Your e-visit answers were reviewed by a board certified advanced clinical practitioner to complete your personal care plan.  Depending upon the condition, your plan could have included both over the counter or prescription medications.  Please review your pharmacy choice.  Make sure the pharmacy is open so you can pick up prescription now.  If there is a problem, you may contact your provider through [employer] and have the prescription routed to another pharmacy.    Your safety is important to us .  If you have drug allergies check your prescription carefully.  For the next 24 hours you can use MyChart to ask questions about today's visit, request a non-urgent call back, or ask for a work or school  excuse.  You will get an email in the next two days asking about your experience.  I hope you that your e-visit has been valuable and will speed your recovery.  I have spent 5 minutes in review of e-visit questionnaire, review and updating patient chart, medical decision making and response to patient.   Teena Shuck, PA-C

## 2024-05-09 ENCOUNTER — Other Ambulatory Visit: Payer: Self-pay | Admitting: Family Medicine

## 2024-05-09 DIAGNOSIS — I10 Essential (primary) hypertension: Secondary | ICD-10-CM

## 2024-05-10 NOTE — Telephone Encounter (Signed)
 Complete

## 2024-05-26 ENCOUNTER — Telehealth: Admitting: Family

## 2024-05-26 DIAGNOSIS — R8290 Unspecified abnormal findings in urine: Secondary | ICD-10-CM

## 2024-05-26 DIAGNOSIS — R10A0 Flank pain, unspecified side: Secondary | ICD-10-CM

## 2024-05-26 DIAGNOSIS — M5441 Lumbago with sciatica, right side: Secondary | ICD-10-CM

## 2024-05-26 NOTE — Progress Notes (Signed)
" °  Because of your flank pain and changes in your urine with back pain, I feel your condition warrants further evaluation and I recommend that you be seen in a face-to-face visit.   NOTE: There will be NO CHARGE for this E-Visit   If you are having a true medical emergency, please call 911.     For an urgent face to face visit, [facility] has multiple urgent care centers for your convenience.  Click the link below for the full list of locations and hours, walk-in wait times, appointment scheduling options and driving directions:  Urgent Care - [place], [place], [place], [place], [place], [place]  [place]     Your MyChart E-visit questionnaire answers were reviewed by a board certified advanced clinical practitioner to complete your personal care plan based on your specific symptoms.    Thank you for using e-Visits.    "

## 2024-05-27 ENCOUNTER — Other Ambulatory Visit: Payer: Self-pay

## 2024-05-27 ENCOUNTER — Encounter (HOSPITAL_BASED_OUTPATIENT_CLINIC_OR_DEPARTMENT_OTHER): Payer: Self-pay

## 2024-05-27 ENCOUNTER — Emergency Department (HOSPITAL_BASED_OUTPATIENT_CLINIC_OR_DEPARTMENT_OTHER)

## 2024-05-27 ENCOUNTER — Emergency Department (HOSPITAL_BASED_OUTPATIENT_CLINIC_OR_DEPARTMENT_OTHER)
Admission: EM | Admit: 2024-05-27 | Discharge: 2024-05-27 | Disposition: A | Discharge status: Home / Self Care | Attending: Emergency Medicine | Admitting: Emergency Medicine

## 2024-05-27 DIAGNOSIS — Z7982 Long term (current) use of aspirin: Secondary | ICD-10-CM | POA: Insufficient documentation

## 2024-05-27 DIAGNOSIS — Z79899 Other long term (current) drug therapy: Secondary | ICD-10-CM | POA: Insufficient documentation

## 2024-05-27 DIAGNOSIS — R109 Unspecified abdominal pain: Secondary | ICD-10-CM | POA: Insufficient documentation

## 2024-05-27 DIAGNOSIS — N390 Urinary tract infection, site not specified: Secondary | ICD-10-CM

## 2024-05-27 DIAGNOSIS — R10A1 Flank pain, right side: Secondary | ICD-10-CM

## 2024-05-27 LAB — URINALYSIS, ROUTINE W REFLEX MICROSCOPIC
Bilirubin Urine: NEGATIVE
Glucose, UA: NEGATIVE mg/dL
Ketones, ur: NEGATIVE mg/dL
Leukocytes,Ua: NEGATIVE
Nitrite: POSITIVE — AB
Protein, ur: NEGATIVE mg/dL
Specific Gravity, Urine: 1.015 (ref 1.005–1.030)
pH: 8.5 — ABNORMAL HIGH (ref 5.0–8.0)

## 2024-05-27 LAB — URINALYSIS, MICROSCOPIC (REFLEX)

## 2024-05-27 LAB — PREGNANCY, URINE: Preg Test, Ur: NEGATIVE

## 2024-05-27 LAB — BASIC METABOLIC PANEL WITH GFR
Anion gap: 8 (ref 5–15)
BUN: 14 mg/dL (ref 6–20)
CO2: 29 mmol/L (ref 22–32)
Calcium: 9.1 mg/dL (ref 8.9–10.3)
Chloride: 103 mmol/L (ref 98–111)
Creatinine, Ser: 0.71 mg/dL (ref 0.44–1.00)
GFR, Estimated: >60 mL/min
Glucose, Bld: 102 mg/dL — ABNORMAL HIGH (ref 70–99)
Potassium: 4.4 mmol/L (ref 3.5–5.1)
Sodium: 140 mmol/L (ref 135–145)

## 2024-05-27 LAB — CBC
HCT: 44.5 % (ref 36.0–46.0)
Hemoglobin: 14.9 g/dL (ref 12.0–15.0)
MCH: 30.5 pg (ref 26.0–34.0)
MCHC: 33.5 g/dL (ref 30.0–36.0)
MCV: 91 fL (ref 80.0–100.0)
Platelets: 224 K/uL (ref 150–400)
RBC: 4.89 MIL/uL (ref 3.87–5.11)
RDW: 13 % (ref 11.5–15.5)
WBC: 7.4 K/uL (ref 4.0–10.5)
nRBC: 0 % (ref 0.0–0.2)

## 2024-05-27 MED ORDER — CEPHALEXIN 500 MG PO CAPS: 500.0000 mg | ORAL_CAPSULE | Freq: Three times a day (TID) | ORAL | 0 refills | Status: AC

## 2024-05-27 MED ORDER — CEPHALEXIN 250 MG PO CAPS
500.0000 mg | ORAL_CAPSULE | Freq: Once | ORAL | Status: AC
  Administered 2024-05-27: 500 mg via ORAL
  Filled 2024-05-27: qty 2

## 2024-05-27 MED ORDER — HYDROCODONE-ACETAMINOPHEN 5-325 MG PO TABS: 1.0000 | ORAL_TABLET | Freq: Four times a day (QID) | ORAL | 0 refills | Status: AC | PRN

## 2024-05-27 NOTE — Discharge Instructions (Signed)
 Begin taking Keflex  as prescribed.  Begin taking hydrocodone  as prescribed as needed for pain.  Follow-up with your primary doctor if symptoms are not improving in the next few days.

## 2024-05-27 NOTE — ED Triage Notes (Signed)
 Pt arrives with c/o flank pain that started a few days ago. Pt does have hx of kidney stones. Pt reports pain also moves to her ribcage area. Pt denies n/v or fevers.

## 2024-05-27 NOTE — ED Provider Notes (Signed)
 " [employer] [facility] AT [facility] Provider Note   CSN: [id number] Arrival date & time: 05/27/24  0015     Patient presents with: Flank Pain   Linda Deleon is a 44 y.o. female.   Patient is a 44 year old female presenting with complaints of right flank pain.  Symptoms started several days ago in the absence of any injury or trauma.  She has a history of kidney stones and this feels similar.  She denies any dysuria or hematuria.  No fevers or chills.  She denies any abdominal pain or bowel complaints.  She was admitted last year after an accidental aspirin overdose and is concerned about her kidneys.       Prior to Admission medications  Medication Sig Start Date End Date Taking? Authorizing Provider  cetirizine (ZYRTEC) 10 MG tablet Take 10 mg by mouth daily as needed for allergies.    [provider]  metoprolol  tartrate (LOPRESSOR ) 50 MG tablet TAKE 1 TABLET BY MOUTH TWICE DAILY. DO NOT TAKE IF SYSTOLIC BLOOD PRESSURE IS LESS THAN . 05/10/24   Jaycee Greig PARAS, NP    Allergies: Sulfamethoxazole-trimethoprim    Review of Systems  All other systems reviewed and are negative.   Updated Vital Signs BP (!) 162/112 (BP Location: Right Arm)   Pulse 67   Temp 98.2 F (36.8 C) (Oral)   Resp 15   Wt 54.4 kg   SpO2 100%   BMI 20.60 kg/m   Physical Exam Vitals and nursing note reviewed.  Constitutional:      General: She is not in acute distress.    Appearance: She is well-developed. She is not diaphoretic.  HENT:     Head: Normocephalic and atraumatic.  Cardiovascular:     Rate and Rhythm: Normal rate and regular rhythm.     Heart sounds: No murmur heard.    No friction rub. No gallop.  Pulmonary:     Effort: Pulmonary effort is normal. No respiratory distress.     Breath sounds: Normal breath sounds. No wheezing.  Abdominal:     General: Bowel sounds are normal. There is no distension.     Palpations: Abdomen is soft.      Tenderness: There is no abdominal tenderness.  Musculoskeletal:        General: Normal range of motion.     Cervical back: Normal range of motion and neck supple.  Skin:    General: Skin is warm and dry.  Neurological:     General: No focal deficit present.     Mental Status: She is alert and oriented to person, place, and time.     (all labs ordered are listed, but only abnormal results are displayed) Labs Reviewed  BASIC METABOLIC PANEL WITH GFR - Abnormal; Notable for the following components:      Result Value   Glucose, Bld 102 (*)    All other components within normal limits  PREGNANCY, URINE  CBC  URINALYSIS, ROUTINE W REFLEX MICROSCOPIC    EKG: None  Radiology: No results found.   Procedures   Medications Ordered in the ED - No data to display                                  Medical Decision Making Amount and/or Complexity of Data Reviewed Labs: ordered. Radiology: ordered.  Risk Prescription drug management.   Patient presenting with complaints of right flank  pain as described in the HPI.  She arrives here with stable vital signs and is afebrile.  She has mild CVA tenderness, but abdomen is benign.  Laboratory studies obtained including CBC and basic metabolic panel, both of which are unremarkable.  Pregnancy test is negative.  Urinalysis does show positive nitrates and is concerning for infection.  CT with renal protocol shows no acute process.  Patient given Keflex  for the UTI and will be discharged with Keflex  and pain medication.  She has a history of salicylate overdose, but her renal function today appears normal.     Final diagnoses:  None    ED Discharge Orders     None          Geroldine Berg, MD 05/27/24 0304  "

## 2024-06-13 ENCOUNTER — Ambulatory Visit: Admitting: Sports Medicine

## 2024-06-25 ENCOUNTER — Ambulatory Visit: Admitting: Sports Medicine
